# Patient Record
Sex: Male | Born: 1990 | Race: Black or African American | Hispanic: No | Marital: Single | Smoking: Current every day smoker
Health system: Southern US, Community
[De-identification: ages and names within clinical notes are randomized; demographics above are authoritative.]

---

## 2018-08-21 ENCOUNTER — Other Ambulatory Visit: Payer: Self-pay

## 2018-08-21 ENCOUNTER — Encounter: Admission: EM | Payer: Self-pay | Attending: Orthopedic Surgery

## 2018-08-21 ENCOUNTER — Inpatient Hospital Stay

## 2018-08-21 ENCOUNTER — Emergency Department

## 2018-08-21 ENCOUNTER — Inpatient Hospital Stay: Admitting: Anesthesiology

## 2018-08-21 ENCOUNTER — Inpatient Hospital Stay
Admission: EM | Admit: 2018-08-21 | Discharge: 2018-08-22 | DRG: 494 | Attending: Orthopedic Surgery | Admitting: Orthopedic Surgery

## 2018-08-21 ENCOUNTER — Encounter: Payer: Self-pay | Admitting: Orthopedic Surgery

## 2018-08-21 DIAGNOSIS — W010XXA Fall on same level from slipping, tripping and stumbling without subsequent striking against object, initial encounter: Secondary | ICD-10-CM | POA: Diagnosis present

## 2018-08-21 DIAGNOSIS — W19XXXA Unspecified fall, initial encounter: Secondary | ICD-10-CM

## 2018-08-21 DIAGNOSIS — S82142A Displaced bicondylar fracture of left tibia, initial encounter for closed fracture: Principal | ICD-10-CM | POA: Diagnosis present

## 2018-08-21 DIAGNOSIS — Z1159 Encounter for screening for other viral diseases: Secondary | ICD-10-CM | POA: Diagnosis not present

## 2018-08-21 DIAGNOSIS — F17200 Nicotine dependence, unspecified, uncomplicated: Secondary | ICD-10-CM | POA: Diagnosis present

## 2018-08-21 DIAGNOSIS — S82122A Displaced fracture of lateral condyle of left tibia, initial encounter for closed fracture: Secondary | ICD-10-CM | POA: Diagnosis present

## 2018-08-21 DIAGNOSIS — T148XXA Other injury of unspecified body region, initial encounter: Secondary | ICD-10-CM

## 2018-08-21 DIAGNOSIS — I10 Essential (primary) hypertension: Secondary | ICD-10-CM | POA: Diagnosis present

## 2018-08-21 HISTORY — PX: KNEE SURGERY: SHX244

## 2018-08-21 HISTORY — PX: ORIF TIBIA PLATEAU: SHX2132

## 2018-08-21 LAB — BASIC METABOLIC PANEL
Anion gap: 9 (ref 5–15)
BUN: 8 mg/dL (ref 6–20)
CO2: 25 mmol/L (ref 22–32)
Calcium: 8.8 mg/dL — ABNORMAL LOW (ref 8.9–10.3)
Chloride: 103 mmol/L (ref 98–111)
Creatinine, Ser: 1.03 mg/dL (ref 0.61–1.24)
GFR calc Af Amer: >60 mL/min (ref >60)
GFR calc non Af Amer: >60 mL/min (ref >60)
Glucose, Bld: 104 mg/dL — ABNORMAL HIGH (ref 70–99)
Potassium: 3.7 mmol/L (ref 3.5–5.1)
Sodium: 137 mmol/L (ref 135–145)

## 2018-08-21 LAB — CBC
HCT: 43.5 % (ref 39.0–52.0)
Hemoglobin: 14.6 g/dL (ref 13.0–17.0)
MCH: 30.3 pg (ref 26.0–34.0)
MCHC: 33.6 g/dL (ref 30.0–36.0)
MCV: 90.2 fL (ref 80.0–100.0)
Platelets: 189 10*3/uL (ref 150–400)
RBC: 4.82 MIL/uL (ref 4.22–5.81)
RDW: 13.4 % (ref 11.5–15.5)
WBC: 9.3 10*3/uL (ref 4.0–10.5)
nRBC: 0 % (ref 0.0–0.2)

## 2018-08-21 LAB — SARS CORONAVIRUS 2 BY RT PCR (HOSPITAL ORDER, PERFORMED IN ~~LOC~~ HOSPITAL LAB): SARS Coronavirus 2: NEGATIVE

## 2018-08-21 SURGERY — OPEN REDUCTION INTERNAL FIXATION (ORIF) TIBIAL PLATEAU
Anesthesia: General | Site: Leg Lower | Laterality: Left

## 2018-08-21 MED ORDER — MORPHINE SULFATE (PF) 2 MG/ML IV SOLN
0.5000 mg | INTRAVENOUS | Status: DC | PRN
  Administered 2018-08-21 (×3): 0.5 mg via INTRAVENOUS
  Filled 2018-08-21 (×3): qty 1

## 2018-08-21 MED ORDER — ACETAMINOPHEN 500 MG PO TABS
500.0000 mg | ORAL_TABLET | Freq: Four times a day (QID) | ORAL | Status: AC
  Administered 2018-08-21 – 2018-08-22 (×4): 500 mg via ORAL
  Filled 2018-08-21 (×4): qty 1

## 2018-08-21 MED ORDER — METHOCARBAMOL 500 MG PO TABS
500.0000 mg | ORAL_TABLET | Freq: Four times a day (QID) | ORAL | Status: DC | PRN
  Administered 2018-08-21: 500 mg via ORAL
  Filled 2018-08-21: qty 1

## 2018-08-21 MED ORDER — CEFAZOLIN SODIUM-DEXTROSE 2-4 GM/100ML-% IV SOLN
2.0000 g | Freq: Once | INTRAVENOUS | Status: AC
  Administered 2018-08-21: 11:00:00 2 g via INTRAVENOUS
  Filled 2018-08-21: qty 100

## 2018-08-21 MED ORDER — FENTANYL CITRATE (PF) 100 MCG/2ML IJ SOLN: 25.0000 ug | INTRAMUSCULAR | Status: DC | PRN

## 2018-08-21 MED ORDER — ONDANSETRON HCL 4 MG PO TABS: 4.0000 mg | ORAL_TABLET | Freq: Four times a day (QID) | ORAL | Status: DC | PRN

## 2018-08-21 MED ORDER — ONDANSETRON HCL 4 MG/2ML IJ SOLN
4.0000 mg | Freq: Once | INTRAMUSCULAR | Status: AC
  Administered 2018-08-21: 03:00:00 4 mg via INTRAVENOUS
  Filled 2018-08-21: qty 2

## 2018-08-21 MED ORDER — TRAMADOL HCL 50 MG PO TABS
50.0000 mg | ORAL_TABLET | Freq: Four times a day (QID) | ORAL | Status: DC
  Administered 2018-08-21 – 2018-08-22 (×3): 50 mg via ORAL
  Filled 2018-08-21 (×3): qty 1

## 2018-08-21 MED ORDER — SODIUM CHLORIDE 0.9 % IV SOLN
INTRAVENOUS | Status: DC
  Administered 2018-08-21 – 2018-08-22 (×2): via INTRAVENOUS

## 2018-08-21 MED ORDER — ENALAPRIL MALEATE 10 MG PO TABS
10.0000 mg | ORAL_TABLET | Freq: Every day | ORAL | Status: DC
  Administered 2018-08-21 – 2018-08-22 (×2): 10 mg via ORAL
  Filled 2018-08-21 (×2): qty 1

## 2018-08-21 MED ORDER — CEFAZOLIN SODIUM 1 G IJ SOLR
INTRAMUSCULAR | Status: AC
  Filled 2018-08-21: qty 20

## 2018-08-21 MED ORDER — PHENYLEPHRINE HCL (PRESSORS) 10 MG/ML IV SOLN
INTRAVENOUS | Status: DC | PRN
  Administered 2018-08-21 (×2): 200 ug via INTRAVENOUS
  Administered 2018-08-21: 100 ug via INTRAVENOUS
  Administered 2018-08-21 (×3): 200 ug via INTRAVENOUS
  Administered 2018-08-21: 100 ug via INTRAVENOUS

## 2018-08-21 MED ORDER — MAGNESIUM HYDROXIDE 400 MG/5ML PO SUSP: 30.0000 mL | Freq: Every day | ORAL | Status: DC | PRN

## 2018-08-21 MED ORDER — CEFAZOLIN SODIUM-DEXTROSE 2-4 GM/100ML-% IV SOLN
2.0000 g | Freq: Four times a day (QID) | INTRAVENOUS | Status: AC
  Administered 2018-08-21 – 2018-08-22 (×3): 2 g via INTRAVENOUS
  Filled 2018-08-21 (×3): qty 100

## 2018-08-21 MED ORDER — HYDROCODONE-ACETAMINOPHEN 7.5-325 MG PO TABS: 1.0000 | ORAL_TABLET | Freq: Once | ORAL | Status: DC | PRN

## 2018-08-21 MED ORDER — FENTANYL CITRATE (PF) 100 MCG/2ML IJ SOLN
INTRAMUSCULAR | Status: DC | PRN
  Administered 2018-08-21 (×5): 50 ug via INTRAVENOUS

## 2018-08-21 MED ORDER — ONDANSETRON HCL 4 MG/2ML IJ SOLN
INTRAMUSCULAR | Status: DC | PRN
  Administered 2018-08-21: 4 mg via INTRAVENOUS

## 2018-08-21 MED ORDER — ACETAMINOPHEN 160 MG/5ML PO SOLN
325.0000 mg | ORAL | Status: DC | PRN
  Filled 2018-08-21: qty 20.3

## 2018-08-21 MED ORDER — HYDROMORPHONE HCL 1 MG/ML IJ SOLN
1.0000 mg | Freq: Once | INTRAMUSCULAR | Status: AC
  Administered 2018-08-21: 1 mg via INTRAVENOUS
  Filled 2018-08-21: qty 1

## 2018-08-21 MED ORDER — ACETAMINOPHEN 325 MG PO TABS: 325.0000 mg | ORAL_TABLET | ORAL | Status: DC | PRN

## 2018-08-21 MED ORDER — HYDROCODONE-ACETAMINOPHEN 5-325 MG PO TABS
1.0000 | ORAL_TABLET | Freq: Four times a day (QID) | ORAL | Status: DC | PRN
  Administered 2018-08-21 – 2018-08-22 (×3): 2 via ORAL
  Filled 2018-08-21 (×4): qty 2

## 2018-08-21 MED ORDER — MIDAZOLAM HCL 5 MG/5ML IJ SOLN
INTRAMUSCULAR | Status: DC | PRN
  Administered 2018-08-21: 2 mg via INTRAVENOUS

## 2018-08-21 MED ORDER — MEPERIDINE HCL 50 MG/ML IJ SOLN
INTRAMUSCULAR | Status: AC
  Filled 2018-08-21: qty 1

## 2018-08-21 MED ORDER — PROPOFOL 10 MG/ML IV BOLUS
INTRAVENOUS | Status: AC
  Filled 2018-08-21: qty 20

## 2018-08-21 MED ORDER — ONDANSETRON HCL 4 MG/2ML IJ SOLN
INTRAMUSCULAR | Status: AC
  Filled 2018-08-21: qty 2

## 2018-08-21 MED ORDER — ZOLPIDEM TARTRATE 5 MG PO TABS
5.0000 mg | ORAL_TABLET | Freq: Every evening | ORAL | Status: DC | PRN
  Administered 2018-08-21: 22:00:00 5 mg via ORAL
  Filled 2018-08-21: qty 1

## 2018-08-21 MED ORDER — PHENYLEPHRINE HCL (PRESSORS) 10 MG/ML IV SOLN
INTRAVENOUS | Status: AC
  Filled 2018-08-21: qty 1

## 2018-08-21 MED ORDER — PROPOFOL 10 MG/ML IV BOLUS
INTRAVENOUS | Status: DC | PRN
  Administered 2018-08-21: 200 mg via INTRAVENOUS

## 2018-08-21 MED ORDER — METHOCARBAMOL 1000 MG/10ML IJ SOLN
500.0000 mg | Freq: Four times a day (QID) | INTRAVENOUS | Status: DC | PRN
  Filled 2018-08-21: qty 5

## 2018-08-21 MED ORDER — MEPERIDINE HCL 50 MG/ML IJ SOLN
6.2500 mg | INTRAMUSCULAR | Status: DC | PRN
  Administered 2018-08-21: 12.5 mg via INTRAVENOUS

## 2018-08-21 MED ORDER — ONDANSETRON HCL 4 MG/2ML IJ SOLN: 4.0000 mg | Freq: Four times a day (QID) | INTRAMUSCULAR | Status: DC | PRN

## 2018-08-21 MED ORDER — METOCLOPRAMIDE HCL 5 MG/ML IJ SOLN: 5.0000 mg | Freq: Three times a day (TID) | INTRAMUSCULAR | Status: DC | PRN

## 2018-08-21 MED ORDER — MORPHINE SULFATE (PF) 4 MG/ML IV SOLN
4.0000 mg | Freq: Once | INTRAVENOUS | Status: AC
  Administered 2018-08-21: 05:00:00 4 mg via INTRAVENOUS
  Filled 2018-08-21: qty 1

## 2018-08-21 MED ORDER — ENOXAPARIN SODIUM 40 MG/0.4ML ~~LOC~~ SOLN
40.0000 mg | SUBCUTANEOUS | Status: DC
  Administered 2018-08-22: 40 mg via SUBCUTANEOUS
  Filled 2018-08-21: qty 0.4

## 2018-08-21 MED ORDER — DOCUSATE SODIUM 100 MG PO CAPS
100.0000 mg | ORAL_CAPSULE | Freq: Two times a day (BID) | ORAL | Status: DC
  Administered 2018-08-21 – 2018-08-22 (×2): 100 mg via ORAL
  Filled 2018-08-21 (×2): qty 1

## 2018-08-21 MED ORDER — GLYCOPYRROLATE 0.2 MG/ML IJ SOLN
INTRAMUSCULAR | Status: DC | PRN
  Administered 2018-08-21: 0.2 mg via INTRAVENOUS

## 2018-08-21 MED ORDER — BISACODYL 5 MG PO TBEC: 5.0000 mg | DELAYED_RELEASE_TABLET | Freq: Every day | ORAL | Status: DC | PRN

## 2018-08-21 MED ORDER — METOCLOPRAMIDE HCL 10 MG PO TABS: 5.0000 mg | ORAL_TABLET | Freq: Three times a day (TID) | ORAL | Status: DC | PRN

## 2018-08-21 MED ORDER — NEOMYCIN-POLYMYXIN B GU 40-200000 IR SOLN
Status: DC | PRN
  Administered 2018-08-21: 4 mL

## 2018-08-21 MED ORDER — ACETAMINOPHEN 325 MG PO TABS: 325.0000 mg | ORAL_TABLET | Freq: Four times a day (QID) | ORAL | Status: DC | PRN

## 2018-08-21 MED ORDER — MAGNESIUM CITRATE PO SOLN
1.0000 | Freq: Once | ORAL | Status: DC | PRN
  Filled 2018-08-21: qty 296

## 2018-08-21 MED ORDER — FENTANYL CITRATE (PF) 250 MCG/5ML IJ SOLN
INTRAMUSCULAR | Status: AC
  Filled 2018-08-21: qty 5

## 2018-08-21 MED ORDER — MIDAZOLAM HCL 2 MG/2ML IJ SOLN
INTRAMUSCULAR | Status: AC
  Filled 2018-08-21: qty 2

## 2018-08-21 MED ORDER — PROMETHAZINE HCL 25 MG/ML IJ SOLN: 6.2500 mg | INTRAMUSCULAR | Status: DC | PRN

## 2018-08-21 MED ORDER — GLYCOPYRROLATE 0.2 MG/ML IJ SOLN
INTRAMUSCULAR | Status: AC
  Filled 2018-08-21: qty 1

## 2018-08-21 MED ORDER — SODIUM CHLORIDE 0.9 % IV SOLN
INTRAVENOUS | Status: DC
  Administered 2018-08-21: 07:00:00 via INTRAVENOUS

## 2018-08-21 SURGICAL SUPPLY — 56 items
BANDAGE ACE 6X5 VEL STRL LF (GAUZE/BANDAGES/DRESSINGS) ×3 IMPLANT
BIT DRILL 2.5X2.75 QC CALB (BIT) ×3 IMPLANT
BIT DRILL CAL (BIT) ×1 IMPLANT
CANISTER SUCT 1200ML W/VALVE (MISCELLANEOUS) ×3 IMPLANT
CAST PADDING 6X4YD ST 30248 (SOFTGOODS) ×2
CHLORAPREP W/TINT 26 (MISCELLANEOUS) ×3 IMPLANT
COOLER POLAR GLACIER W/PUMP (MISCELLANEOUS) ×3 IMPLANT
COVER BACK TABLE REUSABLE LG (DRAPES) ×3 IMPLANT
COVER WAND RF STERILE (DRAPES) ×3 IMPLANT
CUFF TOURN SGL QUICK 24 (TOURNIQUET CUFF) ×2
CUFF TOURN SGL QUICK 30 (TOURNIQUET CUFF)
CUFF TRNQT CYL 24X4X16.5-23 (TOURNIQUET CUFF) ×1 IMPLANT
CUFF TRNQT CYL 30X4X21-28X (TOURNIQUET CUFF) IMPLANT
DRAPE 3/4 80X56 (DRAPES) ×3 IMPLANT
DRAPE C-ARM XRAY 36X54 (DRAPES) ×3 IMPLANT
DRAPE C-ARMOR (DRAPES) ×3 IMPLANT
DRILL BIT CAL (BIT) ×3
ELECT CAUTERY BLADE 6.4 (BLADE) ×3 IMPLANT
ELECT REM PT RETURN 9FT ADLT (ELECTROSURGICAL) ×3
ELECTRODE REM PT RTRN 9FT ADLT (ELECTROSURGICAL) ×1 IMPLANT
GAUZE SPONGE 4X4 12PLY STRL (GAUZE/BANDAGES/DRESSINGS) ×3 IMPLANT
GAUZE XEROFORM 1X8 LF (GAUZE/BANDAGES/DRESSINGS) ×3 IMPLANT
GLOVE SURG SYN 9.0  PF PI (GLOVE) ×2
GLOVE SURG SYN 9.0 PF PI (GLOVE) ×1 IMPLANT
GOWN SRG 2XL LVL 4 RGLN SLV (GOWNS) ×1 IMPLANT
GOWN STRL NON-REIN 2XL LVL4 (GOWNS) ×2
GOWN STRL REUS W/ TWL LRG LVL3 (GOWN DISPOSABLE) ×1 IMPLANT
GOWN STRL REUS W/TWL LRG LVL3 (GOWN DISPOSABLE) ×2
K-WIRE ACE 1.6X6 (WIRE) ×6
KIT TURNOVER KIT A (KITS) ×3 IMPLANT
KWIRE ACE 1.6X6 (WIRE) ×2 IMPLANT
NEEDLE HYPO 22GX1.5 SAFETY (NEEDLE) ×3 IMPLANT
NS IRRIG 1000ML POUR BTL (IV SOLUTION) ×3 IMPLANT
PACK TOTAL KNEE (MISCELLANEOUS) ×3 IMPLANT
PAD ABD DERMACEA PRESS 5X9 (GAUZE/BANDAGES/DRESSINGS) ×3 IMPLANT
PAD PREP 24X41 OB/GYN DISP (PERSONAL CARE ITEMS) ×3 IMPLANT
PAD WRAPON POLAR KNEE (MISCELLANEOUS) ×1 IMPLANT
PADDING CAST COTTON 6X4 ST (SOFTGOODS) ×1 IMPLANT
PLATE LOCK 5H STD LT PROX TIB (Plate) ×3 IMPLANT
SCALPEL PROTECTED #10 DISP (BLADE) ×6 IMPLANT
SCREW CORTICAL 3.5MM  44MM (Screw) ×6 IMPLANT
SCREW CORTICAL 3.5MM 44MM (Screw) ×3 IMPLANT
SCREW LOCK CORT STAR 3.5X54 (Screw) ×3 IMPLANT
SCREW LOCK CORT STAR 3.5X56 (Screw) ×9 IMPLANT
SCREW LOCK CORT STAR 3.5X58 (Screw) ×3 IMPLANT
SCREW LOCK CORT STAR 3.5X60 (Screw) ×9 IMPLANT
SCREW LOCK CORT STAR 3.5X70 (Screw) ×3 IMPLANT
STAPLER SKIN PROX 35W (STAPLE) ×3 IMPLANT
SUT VIC AB 0 CT1 27 (SUTURE) ×2
SUT VIC AB 0 CT1 27XCR 8 STRN (SUTURE) ×1 IMPLANT
SUT VIC AB 1 CT1 36 (SUTURE) ×3 IMPLANT
SUT VIC AB 2-0 CT1 27 (SUTURE) ×2
SUT VIC AB 2-0 CT1 TAPERPNT 27 (SUTURE) ×1 IMPLANT
SYR 10ML LL (SYRINGE) ×3 IMPLANT
TRAY FOLEY MTR SLVR 16FR STAT (SET/KITS/TRAYS/PACK) ×3 IMPLANT
WRAPON POLAR PAD KNEE (MISCELLANEOUS) ×3

## 2018-08-21 NOTE — Anesthesia Post-op Follow-up Note (Signed)
Anesthesia QCDR form completed.        

## 2018-08-21 NOTE — Plan of Care (Signed)
  Problem: Health Behavior/Discharge Planning: Goal: Ability to manage health-related needs will improve Outcome: Progressing   Problem: Clinical Measurements: Goal: Ability to maintain clinical measurements within normal limits will improve Outcome: Progressing Goal: Will remain free from infection Outcome: Progressing Goal: Cardiovascular complication will be avoided Outcome: Progressing   Problem: Activity: Goal: Risk for activity intolerance will decrease Outcome: Progressing   Problem: Nutrition: Goal: Adequate nutrition will be maintained Outcome: Progressing   Problem: Coping: Goal: Level of anxiety will decrease Outcome: Progressing   Problem: Elimination: Goal: Will not experience complications related to bowel motility Outcome: Progressing Goal: Will not experience complications related to urinary retention Outcome: Progressing   Problem: Pain Managment: Goal: General experience of comfort will improve Outcome: Progressing   Problem: Safety: Goal: Ability to remain free from injury will improve Outcome: Progressing   Problem: Skin Integrity: Goal: Risk for impaired skin integrity will decrease Outcome: Progressing   

## 2018-08-21 NOTE — Plan of Care (Signed)
  Problem: Health Behavior/Discharge Planning: Goal: Ability to manage health-related needs will improve 08/21/2018 1539 by Verdene Rio, RN Outcome: Progressing 08/21/2018 0930 by Verdene Rio, RN Outcome: Progressing

## 2018-08-21 NOTE — H&P (Addendum)
Subjective:   Patient is a 28 y.o. male presents with left knee pain. Onset of symptoms was abrupt starting 10 hours ago with unchanged course since that time. The pain is located around the left knee. Patient describes the pain as severe continuous and rated as diffuse. Pain has been associated with a fall he suffered while slipping on water. Patient denies loss of consciousness. Symptoms are aggravated by any motion of the leg. Symptoms improve with nothing. Past history includes no prior problems with the knee.  Previous studies include x-ray and CT done in ER showing comminuted displaced lateral tibial plateau fracture left knee.  Patient Active Problem List   Diagnosis Date Noted  . Closed fracture of lateral portion of left tibial plateau 08/21/2018   Past medical history is positive for hypertension  Current medications: Enalapril 10 mg p.o. daily  Allergies  Allergen Reactions  . Coconut Oil Rash    Social History   Tobacco Use  . Smoking status: Not on file  Substance Use Topics  . Alcohol use: Not on file    No family history on file.  Review of Systems Pertinent items are noted in HPI.  Objective:   Patient Vitals for the past 8 hrs:  BP Temp Temp src Pulse Resp SpO2 Height Weight  08/21/18 0700 (!) 155/95 99.9 F (37.7 C) Oral 73 17 100 % - -  08/21/18 0652 (!) 146/98 99.3 F (37.4 C) Oral 78 16 100 % - -  08/21/18 0600 (!) 141/90 - - 72 - 100 % - -  08/21/18 0530 (!) 147/89 - - 72 - 100 % - -  08/21/18 0503 - - - 65 - 99 % - -  08/21/18 0502 (!) 162/92 - - - - - - -  08/21/18 0330 (!) 151/96 - - 72 - 100 % - -  08/21/18 0238 (!) 162/97 - - - - - - -  08/21/18 0237 - 98.9 F (37.2 C) Oral 94 (!) 24 99 % 5\' 6"  (1.676 m) 68.5 kg   No intake/output data recorded. No intake/output data recorded.    BP (!) 155/95 (BP Location: Left Arm)   Pulse 73   Temp 99.9 F (37.7 C) (Oral)   Resp 17   Ht 5\' 6"  (1.676 m)   Wt 68.5 kg   SpO2 100%   BMI 24.37 kg/m    General Appearance:    Alert, cooperative, no distress, appears stated age  Head:    Normocephalic, without obvious abnormality, atraumatic           Throat:   Lips, mucosa, and tongue normal; teeth and gums normal     Back:     Symmetric, no curvature, ROM normal, no CVA tenderness  Lungs:     Clear to auscultation bilaterally, respirations unlabored  Chest wall:    No tenderness or deformity  Heart:    Regular rate and rhythm, S1 and S2 normal, no murmur, rub   or gallop  Abdomen:     Soft, non-tender, bowel sounds active all four quadrants,    no masses, no organomegaly  Genitalia:    Normal male without lesion, discharge or tenderness  Rectal:    Normal tone, normal prostate, no masses or tenderness;   guaiac negative stool  Extremities: [place] swelling around the left knee with intact skin  Pulses:   2+ and symmetric all extremities  Skin:   Skin color, texture, turgor normal, no rashes or lesions  Lymph nodes:  Cervical, supraclavicular, and axillary nodes normal  Neurologic:  Sensation to the dorsum of the foot is intact however he is unable to extend any of his toes or ankle    ECG: no prior ECG.  Data ReviewRadiology review: Comminuted lateral tibial plateau fracture on both x-ray and CT  Assessment:   Active Problems:   Closed fracture of lateral portion of left tibial plateau   Plan:   Open reduction internal fixation left lateral tibial plateau, risks, benefits, alternatives discussed with patient.

## 2018-08-21 NOTE — Op Note (Signed)
08/21/2018  12:12 PM  PATIENT:  Marvin Roberts  28 y.o. male  PRE-OPERATIVE DIAGNOSIS:  tibial fracture lateral tibial plateau, left displaced  POST-OPERATIVE DIAGNOSIS:  tibial fracture lateral tibial plateau left displaced  PROCEDURE:  Procedure(s): OPEN REDUCTION INTERNAL FIXATION (ORIF) TIBIAL PLATEAU (Left)  SURGEON: Laurene Footman, MD  ASSISTANTS: None  ANESTHESIA:   general  EBL:  Total I/O In: 700 [I.V.:700] Out: 700 [Urine:700]  BLOOD ADMINISTERED:none  DRAINS: none   LOCAL MEDICATIONS USED:  NONE  SPECIMEN:  No Specimen  DISPOSITION OF SPECIMEN:  N/A  COUNTS:  YES  TOURNIQUET:   Total Tourniquet Time Documented: Thigh (Left) - 43 minutes Total: Thigh (Left) - 43 minutes   IMPLANTS: Biomet lateral tibial buttress plate with multiple screws  DICTATION: .Dragon Dictation patient was brought to the operating room and after adequate general anesthesia was obtained a tourniquet was applied to the left upper thigh.  After prepping and draping in the usual sterile manner appropriate patient identification and timeout procedures were completed.  C arm was brought in and with varus stress partial correction of the deformity was obtained.  Appropriate patient identification and timeout procedures were completed and tourniquet raised with a anterior incision over the tibial spine curving laterally towards the fibula.  Thick flaps were developed and the anterior muscles were elevated off the anterior lateral tibia with application of a buttress plate which was then forced more proximally to help lift that lateral fragment and held in place with K wires.  Cortical screws were then placed to bring the plate up against the bone with 3 cortical screws placed.  The 2 oblique screws were inserted with fluoroscopic guidance and position of the plate checked in AP and lateral imaging showing good alignment of the plate and fracture.  The proximal screws were all filled using standard  technique drilling measuring off the drill guide and placing the locking screws.  With fluoroscopy fluoroscopic views going through range of motion there is no penetration of hardware into the joint on AP and lateral imaging the displaced lateral tibial depressed fragment appeared to be elevated and the intra-articular split appeared to be reduced.  The knee was stable to examination.  The wound was copiously irrigated following this with tourniquet let down.  #1 Vicryl was used to repair the deep fascia with 2-0 Vicryl subcutaneously and skin staples followed by Xeroform 4 x 4's ABD web roll Polar Care Ace wrap patient sent to recovery in stable condition  PLAN OF CARE: Admit for overnight observation  PATIENT DISPOSITION:  PACU - hemodynamically stable.

## 2018-08-21 NOTE — ED Notes (Signed)
Patient resting quietly with eyes closed

## 2018-08-21 NOTE — Anesthesia Preprocedure Evaluation (Signed)
Anesthesia Evaluation  Patient identified by MRN, date of birth, ID band Patient awake    Reviewed: Allergy & Precautions, H&P , NPO status , reviewed documented beta blocker date and time   Airway Mallampati: II  TM Distance: >3 FB Neck ROM: full    Dental  (+) Chipped, Dental Advidsory Given Gapped teeth, no loose:   Pulmonary Current Smoker,    Pulmonary exam normal        Cardiovascular hypertension, Normal cardiovascular exam     Neuro/Psych    GI/Hepatic neg GERD  ,  Endo/Other    Renal/GU      Musculoskeletal   Abdominal   Peds  Hematology   Anesthesia Other Findings History reviewed. No pertinent past medical history x HTN. History reviewed. No pertinent surgical history. BMI    Body Mass Index: 24.37 kg/m     Reproductive/Obstetrics                             Anesthesia Physical Anesthesia Plan  ASA: II and emergent  Anesthesia Plan: General   Post-op Pain Management:    Induction: Intravenous  PONV Risk Score and Plan: Ondansetron and Treatment may vary due to age or medical condition  Airway Management Planned: LMA  Additional Equipment:   Intra-op Plan:   Post-operative Plan: Extubation in OR  Informed Consent: I have reviewed the patients History and Physical, chart, labs and discussed the procedure including the risks, benefits and alternatives for the proposed anesthesia with the patient or authorized representative who has indicated his/her understanding and acceptance.     Dental Advisory Given  Plan Discussed with: CRNA  Anesthesia Plan Comments:         Anesthesia Quick Evaluation

## 2018-08-21 NOTE — ED Notes (Signed)
Patient transported to CT 

## 2018-08-21 NOTE — ED Triage Notes (Signed)
Pt from jail with left leg injury. Pt assisted out of cop car onto bed. Pt with deformity noted to upper tibial area.

## 2018-08-21 NOTE — Progress Notes (Signed)
15 minute call to floor. 

## 2018-08-21 NOTE — Anesthesia Postprocedure Evaluation (Signed)
Anesthesia Post Note  Patient: Marcene Brawn  Procedure(s) Performed: OPEN REDUCTION INTERNAL FIXATION (ORIF) TIBIAL PLATEAU (Left Leg Lower)  Patient location during evaluation: PACU Anesthesia Type: General Level of consciousness: awake and alert Pain management: pain level controlled Vital Signs Assessment: post-procedure vital signs reviewed and stable Respiratory status: spontaneous breathing, nonlabored ventilation and respiratory function stable Cardiovascular status: blood pressure returned to baseline and stable Postop Assessment: no apparent nausea or vomiting Anesthetic complications: no     Last Vitals:  Vitals:   08/21/18 1538 08/21/18 1642  BP: (!) 149/79 (!) 156/82  Pulse: (!) 108 (!) 114  Resp: 18 17  Temp: 36.8 C   SpO2: 100% 100%    Last Pain:  Vitals:   08/21/18 1538  TempSrc: Oral  PainSc:                  Alphonsus Sias

## 2018-08-21 NOTE — ED Notes (Signed)
Patient oxygen saturation decreased to 84% on room air. Patient placed on 2l oxygen and saturations increased to 98%. Dr. Alfred Levins notified.

## 2018-08-21 NOTE — ED Provider Notes (Signed)
[facility] Emergency Department Provider Note  ____________________________________________  Time seen: Approximately 4:18 AM  I have reviewed the triage vital signs and the nursing notes.   HISTORY  Chief Complaint Leg Injury   HPI Marvin Roberts is a 28 y.o. male with a history of hypertension who presents for evaluation of left knee pain.  Patient is currently incarcerated.  He was playing outside when he slipped in water and fell onto his knee.  Unable to get up or bear any weight.  Patient is complaining of severe constant sharp pain located in his left knee radiating up and down his leg.  He denies head trauma or LOC.  Denies neck pain or back pain.  Denies any other extremity pain  PMH HTN  Allergies Coconut oil  No family history on file.  Social History Smoking - yes Alcohol - denies Drugs - denies  Review of Systems Constitutional: Negative for fever. Eyes: Negative for visual changes. ENT: Negative for facial injury or neck injury Cardiovascular: Negative for chest injury. Respiratory: Negative for shortness of breath. Negative for chest wall injury. Gastrointestinal: Negative for abdominal pain or injury. Genitourinary: Negative for dysuria. Musculoskeletal: Negative for back injury, + L knee pain Skin: Negative for laceration/abrasions. Neurological: Negative for head injury.   ____________________________________________   PHYSICAL EXAM:  VITAL SIGNS: ED Triage Vitals  Enc Vitals Group     BP 08/21/18 0238 (!) 162/97     Pulse Rate 08/21/18 0237 94     Resp 08/21/18 0237 (!) 24     Temp 08/21/18 0237 98.9 F (37.2 C)     Temp Source 08/21/18 0237 Oral     SpO2 08/21/18 0237 99 %     Weight 08/21/18 0237 151 lb (68.5 kg)     Height 08/21/18 0237 5\' 6"  (1.676 m)     Head Circumference --      Peak Flow --      Pain Score 08/21/18 0237 10     Pain Loc --      Pain Edu? --      Excl. in [place]? --    Full spinal  precautions maintained throughout the trauma exam. Constitutional: Alert and oriented. Screaming in pain. Does not appear intoxicated. HEENT Head: Normocephalic and atraumatic. Face: No facial bony tenderness. Stable midface Ears: No hemotympanum bilaterally. No Battle sign Eyes: No eye injury. PERRL. No raccoon eyes Nose: Nontender. No epistaxis. No rhinorrhea Mouth/Throat: Mucous membranes are moist. No oropharyngeal blood. No dental injury. Airway patent without stridor. Normal voice. Neck: no C-collar. No midline c-spine tenderness.  Cardiovascular: Normal rate, regular rhythm. Normal and symmetric distal pulses are present in all extremities. Pulmonary/Chest: Chest wall is stable and nontender to palpation/compression. Normal respiratory effort. Breath sounds are normal. No crepitus.  Abdominal: Soft, nontender, non distended. Musculoskeletal: Patient has severe tenderness to palpation over the tibial plateau on the left with swelling but no obvious deformity. Nontender with normal full range of motion in all other joints. No deformities. No thoracic or lumbar midline spinal tenderness. Pelvis is stable. Skin: Skin is warm, dry and intact. No abrasions or contutions. Psychiatric: Speech and behavior are appropriate. Neurological: Normal speech and language. Moves all extremities to command. No gross focal neurologic deficits are appreciated.  Glascow Coma Score: 4 - Opens eyes on own 6 - Follows simple motor commands 5 - Alert and oriented GCS: 15  ____________________________________________   LABS (all labs ordered are listed, but only abnormal results are displayed)  Labs Reviewed  SARS CORONAVIRUS 2 (HOSPITAL ORDER, PERFORMED IN [facility])  HIV ANTIBODY (ROUTINE TESTING W REFLEX)  CBC  BASIC METABOLIC PANEL   ____________________________________________  EKG  none  ____________________________________________  RADIOLOGY  I have personally reviewed  the images performed during this visit and I agree with the Radiologist's read.   Interpretation by Radiologist:  Dg Tibia/fibula Left  Result Date: 08/21/2018 CLINICAL DATA:  Left knee pain after slip on water and fall. EXAM: LEFT TIBIA AND FIBULA - 2 VIEW COMPARISON:  None. FINDINGS: Lateral tibial plateau fracture better assessed on concurrent knee exam. Distal tibia is intact. No evidence of fibular fracture. Soft tissue edema about the knee and proximal aspect of the lower leg. IMPRESSION: Lateral tibial plateau fracture. Distal tibia and fibula are intact. Electronically Signed   By: Narda RutherfordMelanie  Sanford M.D.   On: 08/21/2018 03:28   Dg Knee Complete 4 Views Left  Result Date: 08/21/2018 CLINICAL DATA:  Left knee pain after slip on water and fall. EXAM: LEFT KNEE - COMPLETE 4+ VIEW COMPARISON:  None. FINDINGS: Comminuted displaced lateral tibial plateau fracture. Fracture extends to the tibial spine. No significant metaphyseal component demonstrated radiographically. No additional fracture. Large knee joint effusion. IMPRESSION: Comminuted displaced lateral tibial plateau fracture. Electronically Signed   By: Narda RutherfordMelanie  Sanford M.D.   On: 08/21/2018 03:29     ____________________________________________   PROCEDURES  Procedure(s) performed: None Procedures Critical Care performed: None ____________________________________________   INITIAL IMPRESSION / ASSESSMENT AND PLAN / ED COURSE  28 y.o. male with a history of hypertension who presents for evaluation of left knee pain status post mechanical fall.  X-ray confirms comminuted displaced lateral tibial plateau fracture.  Discussed with Dr. Rosita KeaMenz who recommended admission for surgical repair.  He also requested a CT of the knee which has been done.  No labs required per Dr. Rosita KeaMenz. Patient made NPO.       As part of my medical decision making, I reviewed the following data within the electronic MEDICAL RECORD NUMBER Nursing notes reviewed and  incorporated, Old chart reviewed, Radiograph reviewed , A consult was requested and obtained from this/these consultant(s) Orthopedics, Notes from prior ED visits and [place] Controlled Substance Database   Patient was evaluated in Emergency Department today for the symptoms described in the history of present illness. Patient was evaluated in the context of the global COVID-19 pandemic, which necessitated consideration that the patient might be at risk for infection with the SARS-CoV-2 virus that causes COVID-19. Institutional protocols and algorithms that pertain to the evaluation of patients at risk for COVID-19 are in a state of rapid change based on information released by regulatory bodies including the CDC and federal and state organizations. These policies and algorithms were followed during the patient's care in the ED.   ____________________________________________   FINAL CLINICAL IMPRESSION(S) / ED DIAGNOSES   Final diagnoses:  Fall, initial encounter  Closed fracture of left tibial plateau, initial encounter      NEW MEDICATIONS STARTED DURING THIS VISIT:  ED Discharge Orders    None       Note:  This document was prepared using Dragon voice recognition software and may include unintentional dictation errors.    Don PerkingVeronese, [place], MD 08/21/18 [phone]

## 2018-08-21 NOTE — ED Notes (Signed)
Patient requesting more pain medication. Dr. Alfred Levins notified.

## 2018-08-21 NOTE — Progress Notes (Signed)
Reinforced dressing with abd pads and an additional ace wrap.

## 2018-08-21 NOTE — Anesthesia Procedure Notes (Signed)
Procedure Name: LMA Insertion Performed by: Royetta Probus, CRNA Pre-anesthesia Checklist: Patient identified, Patient being monitored, Timeout performed, Emergency Drugs available and Suction available Patient Re-evaluated:Patient Re-evaluated prior to induction Oxygen Delivery Method: Circle system utilized Preoxygenation: Pre-oxygenation with 100% oxygen Induction Type: IV induction Ventilation: Mask ventilation without difficulty LMA: LMA inserted LMA Size: 4.0 Tube type: Oral Number of attempts: 1 Placement Confirmation: positive ETCO2 and breath sounds checked- equal and bilateral Tube secured with: Tape Dental Injury: Teeth and Oropharynx as per pre-operative assessment        

## 2018-08-21 NOTE — Transfer of Care (Signed)
Immediate Anesthesia Transfer of Care Note  Patient: Marvin Roberts  Procedure(s) Performed: OPEN REDUCTION INTERNAL FIXATION (ORIF) TIBIAL PLATEAU (Left Leg Lower)  Patient Location: PACU  Anesthesia Type:General  Level of Consciousness: sedated  Airway & Oxygen Therapy: Patient Spontanous Breathing and Patient connected to face mask oxygen  Post-op Assessment: Report given to RN and Post -op Vital signs reviewed and stable  Post vital signs: Reviewed  Last Vitals:  Vitals Value Taken Time  BP 102/50 08/21/18 1214  Temp 36.4 C 08/21/18 1214  Pulse 97 08/21/18 1214  Resp 12 08/21/18 1214  SpO2 100 % 08/21/18 1214    Last Pain:  Vitals:   08/21/18 0734  TempSrc:   PainSc: Asleep      Patients Stated Pain Goal: 0 (37/10/62 6948)  Complications: No apparent anesthesia complications

## 2018-08-22 ENCOUNTER — Encounter: Payer: Self-pay | Admitting: Orthopedic Surgery

## 2018-08-22 LAB — MRSA PCR SCREENING: MRSA by PCR: NEGATIVE

## 2018-08-22 MED ORDER — TRAMADOL HCL 50 MG PO TABS: 50.0000 mg | ORAL_TABLET | Freq: Four times a day (QID) | ORAL | 0 refills | Status: DC

## 2018-08-22 MED ORDER — HYDROCODONE-ACETAMINOPHEN 5-325 MG PO TABS: 1.0000 | ORAL_TABLET | Freq: Four times a day (QID) | ORAL | 0 refills | Status: DC | PRN

## 2018-08-22 MED ORDER — MORPHINE SULFATE (PF) 2 MG/ML IV SOLN
2.0000 mg | INTRAVENOUS | Status: AC
  Administered 2018-08-22: 2 mg via INTRAVENOUS
  Filled 2018-08-22: qty 1

## 2018-08-22 MED ORDER — HYDRALAZINE HCL 20 MG/ML IJ SOLN
10.0000 mg | Freq: Once | INTRAMUSCULAR | Status: AC
  Administered 2018-08-22: 02:00:00 10 mg via INTRAVENOUS
  Filled 2018-08-22: qty 1

## 2018-08-22 MED ORDER — ASPIRIN EC 325 MG PO TBEC: 325.0000 mg | DELAYED_RELEASE_TABLET | Freq: Every day | ORAL | 0 refills | Status: DC

## 2018-08-22 NOTE — Progress Notes (Signed)
  Subjective: 1 Day Post-Op Procedure(s) (LRB): OPEN REDUCTION INTERNAL FIXATION (ORIF) TIBIAL PLATEAU (Left) Patient reports pain as 8 on 0-10 scale.   Patient is well, and has had no acute complaints or problems Patient is currently incarcerated, will plan for return to custody upon discharge. Negative for chest pain and shortness of breath Fever: no Gastrointestinal:Negative for nausea and vomiting  Objective: Vital signs in last 24 hours: Temp:  [97.6 F (36.4 C)-100 F (37.8 C)] 98.3 F (36.8 C) (07/20 0736) Pulse Rate:  [79-118] 95 (07/20 0736) Resp:  [12-22] 19 (07/20 0411) BP: (102-170)/(50-102) 141/99 (07/20 0736) SpO2:  [97 %-100 %] 99 % (07/20 0736)  Intake/Output from previous day:  Intake/Output Summary (Last 24 hours) at 08/22/2018 0741 Last data filed at 08/22/2018 0530 Gross per 24 hour  Intake 2423.08 ml  Output 2360 ml  Net 63.08 ml    Intake/Output this shift: No intake/output data recorded.  Labs: Recent Labs    08/21/18 0658  HGB 14.6   Recent Labs    08/21/18 0658  WBC 9.3  RBC 4.82  HCT 43.5  PLT 189   Recent Labs    08/21/18 0658  NA 137  K 3.7  CL 103  CO2 25  BUN 8  CREATININE 1.03  GLUCOSE 104*  CALCIUM 8.8*   No results for input(s): LABPT, INR in the last 72 hours.   EXAM General - Patient is Alert and Appropriate Extremity - Sensation intact distally Intact pulses distally  Bulky dressing is intact to the left knee, patient anxious with any manipulation of the leg. Compartments to the left lower extremity are soft to palpation.  No evidence of compartment syndrome. Dressing/Incision - Bulky dressing intact to the left leg. Motor Function - intact, moving foot and toes well on exam.  Abdomen soft with normal bowel sounds.  History reviewed. No pertinent past medical history.  Assessment/Plan: 1 Day Post-Op Procedure(s) (LRB): OPEN REDUCTION INTERNAL FIXATION (ORIF) TIBIAL PLATEAU (Left) Active Problems:   Closed  fracture of lateral portion of left tibial plateau  Estimated body mass index is 24.37 kg/m as calculated from the following:   Height as of this encounter: 5\' 6"  (1.676 m).   Weight as of this encounter: 68.5 kg. Advance diet Up with therapy D/C IV fluids when tolerating po intake.  Vitals reviewed this AM. Up with therapy today. Plan for discharge after completion of PT.  DVT Prophylaxis - Lovenox, Foot Pumps and TED hose Non-weightbearing to the left leg.  Raquel James, PA-C [facility] Orthopaedic Surgery 08/22/2018, 7:41 AM

## 2018-08-22 NOTE — Discharge Summary (Signed)
Physician Discharge Summary  Patient ID: Marvin Roberts MRN: [id number] DOB/AGE: 27/06/1990 27 y.o.  Admit date: 08/21/2018 Discharge date: 08/22/2018  Admission Diagnoses:  Fall, initial encounter [W19.XXXA] Closed fracture of left tibial plateau, initial encounter [S82.142A]  Discharge Diagnoses: Patient Active Problem List   Diagnosis Date Noted  . Closed fracture of lateral portion of left tibial plateau 08/21/2018    History reviewed. No pertinent past medical history.   Transfusion: None.   Consultants (if any):   Discharged Condition: Improved  Hospital Course: Marvin Roberts is an 28 y.o. male who was admitted 08/21/2018 with a diagnosis of a left displaced lateral tibial plateau fracture and went to the operating room on 08/21/2018 and underwent the above named procedures.    Surgeries: Procedure(s): OPEN REDUCTION INTERNAL FIXATION (ORIF) TIBIAL PLATEAU on 08/21/2018 Patient tolerated the surgery well. Taken to PACU where she was stabilized and then transferred to the orthopedic floor.  Started on Lovenox 40mg  q 24 hrs. Foot pumps applied bilaterally at 80 mm. Heels elevated on bed with rolled towels. No evidence of DVT. Negative Homan. Physical therapy started on day #1 for gait training and transfer. OT started day #1 for ADL and assisted devices.  Patient's IV was removed on POD1.  Implants: Biomet lateral tibial buttress plate with multiple screws  He was given perioperative antibiotics:  Anti-infectives (From admission, onward)   Start     Dose/Rate Route Frequency Ordered Stop   08/21/18 1652  ceFAZolin (ANCEF) IVPB 2g/100 mL premix     2 g 200 mL/hr over 30 Minutes Intravenous Every 6 hours 08/21/18 1331 08/22/18 0600   08/21/18 0945  ceFAZolin (ANCEF) IVPB 2g/100 mL premix     2 g 200 mL/hr over 30 Minutes Intravenous  Once 08/21/18 0940 08/21/18 1112    .  He was given sequential compression devices, early ambulation, and Lovenox for DVT  prophylaxis.  He benefited maximally from the hospital stay and there were no complications.    Recent vital signs:  Vitals:   08/22/18 0411 08/22/18 0736  BP: 140/84 (!) 141/99  Pulse: (!) 107 95  Resp: 19   Temp: 98.6 F (37 C) 98.3 F (36.8 C)  SpO2: 98% 99%    Recent laboratory studies:  Lab Results  Component Value Date   HGB 14.6 08/21/2018   Lab Results  Component Value Date   WBC 9.3 08/21/2018   PLT 189 08/21/2018   No results found for: INR Lab Results  Component Value Date   NA 137 08/21/2018   K 3.7 08/21/2018   CL 103 08/21/2018   CO2 25 08/21/2018   BUN 8 08/21/2018   CREATININE 1.03 08/21/2018   GLUCOSE 104 (H) 08/21/2018    Discharge Medications:   Allergies as of 08/22/2018      Reactions   Coconut Oil Rash      Medication List    TAKE these medications   aspirin EC 325 MG tablet Take 1 tablet (325 mg total) by mouth daily.   HYDROcodone-acetaminophen 5-325 MG tablet Commonly known as: NORCO/VICODIN Take 1-2 tablets by mouth every 6 (six) hours as needed for moderate pain.   traMADol 50 MG tablet Commonly known as: ULTRAM Take 1 tablet (50 mg total) by mouth every 6 (six) hours.       Diagnostic Studies: Dg Tibia/fibula Left  Result Date: 08/21/2018 CLINICAL DATA:  ORIF left tibia. EXAM: DG C-ARM 61-120 MIN; LEFT TIBIA AND FIBULA - 2 VIEW COMPARISON:  Earlier same day. FINDINGS:  Examination demonstrates internal fixation with lateral plate and screws fixating patient's lateral tibial plateau fracture. Hardware is intact with anatomic alignment over the fracture site. Note that the most inferior screw projects 4 mm medial to the medial tibial cortex. Remainder of the exam is unchanged. IMPRESSION: Internal fixation of lateral tibial plateau fracture with hardware intact and anatomic alignment over the fracture site. Electronically Signed   By: Marin Olp M.D.   On: 08/21/2018 13:25   Dg Tibia/fibula Left  Result Date:  08/21/2018 CLINICAL DATA:  Left knee pain after slip on water and fall. EXAM: LEFT TIBIA AND FIBULA - 2 VIEW COMPARISON:  None. FINDINGS: Lateral tibial plateau fracture better assessed on concurrent knee exam. Distal tibia is intact. No evidence of fibular fracture. Soft tissue edema about the knee and proximal aspect of the lower leg. IMPRESSION: Lateral tibial plateau fracture. Distal tibia and fibula are intact. Electronically Signed   By: Keith Rake M.D.   On: 08/21/2018 03:28   Ct Knee Left Wo Contrast  Result Date: 08/21/2018 CLINICAL DATA:  Initial evaluation for acute injury, fracture. EXAM: CT OF THE LEFT KNEE WITHOUT CONTRAST TECHNIQUE: Multidetector CT imaging of the left knee was performed according to the standard protocol. Multiplanar CT image reconstructions were also generated. COMPARISON:  Prior radiograph from earlier the same day. FINDINGS: Bones/Joint/Cartilage Acute comminuted fracture involving the lateral tibial plateau with intra-articular extension. Associated mild 3 mm of depression. Extension through the intercondylar eminences. Medial plateau intact. Distal femur and femoral condyles intact. Patella intact. Proximal fibula and fibular head intact. No discrete osseous lesions. Ligaments Suboptimally assessed by CT. Muscles and Tendons No visible acute muscular injury. Linear calcific density seen along the tendinous insertion for the tibialis anterior muscle. Soft tissues Diffuse soft tissue swelling adjacent to the lateral femoral tibial joint space compartment. Large joint effusion partially visualized. IMPRESSION: Acute comminuted and mildly depressed lateral tibial plateau fracture as above. Electronically Signed   By: Jeannine Boga M.D.   On: 08/21/2018 04:52   Dg Knee Complete 4 Views Left  Result Date: 08/21/2018 CLINICAL DATA:  Left knee pain after slip on water and fall. EXAM: LEFT KNEE - COMPLETE 4+ VIEW COMPARISON:  None. FINDINGS: Comminuted displaced  lateral tibial plateau fracture. Fracture extends to the tibial spine. No significant metaphyseal component demonstrated radiographically. No additional fracture. Large knee joint effusion. IMPRESSION: Comminuted displaced lateral tibial plateau fracture. Electronically Signed   By: Keith Rake M.D.   On: 08/21/2018 03:29   Dg C-arm 1-60 Min  Result Date: 08/21/2018 CLINICAL DATA:  ORIF left tibia. EXAM: DG C-ARM 61-120 MIN; LEFT TIBIA AND FIBULA - 2 VIEW COMPARISON:  Earlier same day. FINDINGS: Examination demonstrates internal fixation with lateral plate and screws fixating patient's lateral tibial plateau fracture. Hardware is intact with anatomic alignment over the fracture site. Note that the most inferior screw projects 4 mm medial to the medial tibial cortex. Remainder of the exam is unchanged. IMPRESSION: Internal fixation of lateral tibial plateau fracture with hardware intact and anatomic alignment over the fracture site. Electronically Signed   By: Marin Olp M.D.   On: 08/21/2018 13:25   Disposition: Plan for discharge back into custody today pending progress with PT this morning.  Follow-up Information    Hessie Knows, MD Follow up in 14 day(s).   Specialty: Orthopedic Surgery Why: Staple Removal. Contact information: [address] [place] [address] [phone]  Signed: Meriel PicaJames L  PA-C 08/22/2018, 7:47 AM

## 2018-08-22 NOTE — Evaluation (Signed)
Physical Therapy Evaluation Patient Details Name: Marvin Roberts MRN: [id number] DOB: 15-May-1990 Today's Date: 08/22/2018   History of Present Illness  Pt admitted for L tibial plateau fx secondary to slipping on water causing fall. Pt is now s/p ORIF of tibial plateau. Pt currently incarcerated, escorted by two police officers and currently handcuffed to bed. No PMH history on file  Clinical Impression  Pt is a pleasant 28 year old male who was admitted for L tibial plateau fx s/p ORIF. Pt performs bed mobility, transfers, and ambulation with cga and use of B crutches. Pt familiar with crutches, demonstrated technique prior to performance. Pt reports pain with all mobility. Pt does not require any further PT needs at this time. Pt will be dc in house and does not require follow up. RN aware. Will dc current orders.     Follow Up Recommendations No PT follow up    Equipment Recommendations  Crutches(set to 5'6")    Recommendations for Other Services       Precautions / Restrictions Precautions Precautions: Fall Restrictions Weight Bearing Restrictions: Yes LLE Weight Bearing: Non weight bearing      Mobility  Bed Mobility Overal bed mobility: Needs Assistance Bed Mobility: Supine to Sit     Supine to sit: Min guard     General bed mobility comments: cued for hooking under surgical leg. Once seated, able to sit with upright posture.  Transfers Overall transfer level: Needs assistance Equipment used: Crutches Transfers: Sit to/from Stand Sit to Stand: Min guard         General transfer comment: transfers performed with B crutches. Safe technique. Able to maintain correct WBing once standing  Ambulation/Gait Ambulation/Gait assistance: Min guard Gait Distance (Feet): 40 Feet Assistive device: Crutches Gait Pattern/deviations: Step-to pattern     General Gait Details: hop on R LE in room. Requests not to ambulate in hallway due to intense pain with hopping. Safe  technique with no LOB. Demonstration for crutches given prior to performance  Stairs Stairs: (pt refused to do stairs)          Wheelchair Mobility    Modified Rankin (Stroke Patients Only)       Balance Overall balance assessment: History of Falls                                           Pertinent Vitals/Pain Pain Assessment: 0-10 Pain Score: 7  Pain Location: L leg distal to knee Pain Descriptors / Indicators: Operative site guarding Pain Intervention(s): Limited activity within patient's tolerance;Premedicated before session;Ice applied    Home Living Family/patient expects to be discharged to:: [facility]                 Additional Comments: per police, pt may be able to stay on first level in medical side. No stairs for entry    Prior Function Level of Independence: Independent               Hand Dominance        Extremity/Trunk Assessment   Upper Extremity Assessment Upper Extremity Assessment: Overall WFL for tasks assessed    Lower Extremity Assessment Lower Extremity Assessment: Generalized weakness(L LE grossly 3/5; R LE grossly 5/5)       Communication   Communication: No difficulties  Cognition Arousal/Alertness: Awake/alert Behavior During Therapy: WFL for tasks assessed/performed Overall Cognitive Status: Within Functional Limits for  tasks assessed                                        General Comments      Exercises Other Exercises Other Exercises: supine ther-ex performed on B LE including AP, SLRs, and hip abd/add. R LE continued with SAQ. All ther-ex performed x 10 reps with cga. Safe technique   Assessment/Plan    PT Assessment Patent does not need any further PT services  PT Problem List         PT Treatment Interventions      PT Goals (Current goals can be found in the Care Plan section)  Acute Rehab PT Goals Patient Stated Goal: to leave the hospital PT Goal  Formulation: All assessment and education complete, DC therapy Time For Goal Achievement: 08/22/18 Potential to Achieve Goals: Good    Frequency     Barriers to discharge        Co-evaluation               AM-PAC PT "6 Clicks" Mobility  Outcome Measure Help needed turning from your back to your side while in a flat bed without using bedrails?: A Little Help needed moving from lying on your back to sitting on the side of a flat bed without using bedrails?: A Little Help needed moving to and from a bed to a chair (including a wheelchair)?: A Little Help needed standing up from a chair using your arms (e.g., wheelchair or bedside chair)?: A Little Help needed to walk in hospital room?: A Little Help needed climbing 3-5 steps with a railing? : A Little 6 Click Score: 18    End of Session Equipment Utilized During Treatment: Gait belt Activity Tolerance: Patient tolerated treatment well Patient left: in bed(left with police and pt handcuffed to bed) Nurse Communication: Mobility status PT Visit Diagnosis: Muscle weakness (generalized) (M62.81);History of falling (Z91.81);Difficulty in walking, not elsewhere classified (R26.2);Pain Pain - Right/Left: Left Pain - part of body: Leg    Time: [id number] PT Time Calculation (min) (ACUTE ONLY): 27 min   Charges:   PT Evaluation $PT Eval Low Complexity: 1 Low PT Treatments $Therapeutic Exercise: 8-22 mins        Elizabeth PalauStephanie Brendyn Mclaren, PT, DPT [phone]   Marlia Schewe 08/22/2018, 11:04 AM

## 2018-08-22 NOTE — Progress Notes (Signed)
Discharge instructions reviewed with patient and sheriff. Verbalized understanding. IV removed. Patient left with sheriff and NT to medical mall.

## 2018-08-22 NOTE — Discharge Instructions (Signed)
Diet: As you were doing prior to hospitalization   Shower:  May shower but keep the wounds dry, use an occlusive plastic wrap, NO SOAKING IN TUB.  If the bandage gets wet, change with a clean dry gauze.  Dressing:  You may change your dressing as needed. Change the dressing with sterile gauze dressing.    Activity:  Increase activity slowly as tolerated, but follow the weight bearing instructions below.  No lifting or driving for 6 weeks.  Weight Bearing:   Non weightbearing to the left leg.  Blood Clot Prevention: Continue to pump ankle up and down routinely each day.  Take 1 325mg  aspirin daily for DVT prevention.  To prevent constipation: you may use a stool softener such as -  Colace (over the counter) 100 mg by mouth twice a day  Drink plenty of fluids (prune juice may be helpful) and high fiber foods Miralax (over the counter) for constipation as needed.    Itching:  If you experience itching with your medications, try taking only a single pain pill, or even half a pain pill at a time.  You may take up to 10 pain pills per day, and you can also use benadryl over the counter for itching or also to help with sleep.   Precautions:  If you experience chest pain or shortness of breath - call 911 immediately for transfer to the hospital emergency department!!  If you develop a fever greater that 101 F, purulent drainage from wound, increased redness or drainage from wound, or calf pain-Call [place]                                              Follow- Up Appointment:  Please call for an appointment to be seen in 2 weeks at [facility]

## 2018-08-22 NOTE — Plan of Care (Signed)
  Problem: Health Behavior/Discharge Planning: Goal: Ability to manage health-related needs will improve Outcome: Progressing   Problem: Clinical Measurements: Goal: Ability to maintain clinical measurements within normal limits will improve Outcome: Progressing Goal: Will remain free from infection Outcome: Progressing Goal: Cardiovascular complication will be avoided Outcome: Progressing   Problem: Activity: Goal: Risk for activity intolerance will decrease Outcome: Progressing   Problem: Nutrition: Goal: Adequate nutrition will be maintained Outcome: Progressing   Problem: Coping: Goal: Level of anxiety will decrease Outcome: Progressing   Problem: Elimination: Goal: Will not experience complications related to bowel motility Outcome: Progressing Goal: Will not experience complications related to urinary retention Outcome: Progressing   Problem: Pain Managment: Goal: General experience of comfort will improve Outcome: Progressing   Problem: Safety: Goal: Ability to remain free from injury will improve Outcome: Progressing   Problem: Skin Integrity: Goal: Risk for impaired skin integrity will decrease Outcome: Progressing   

## 2018-08-22 NOTE — Progress Notes (Signed)
Patient laying quietly in bed, rates his pain 7/10, B/P elevated 147/102 Gardiner Barefoot NP contacted. New orders given.

## 2018-08-22 NOTE — TOC Transition Note (Signed)
Transition of Care [facility]) - Progression Note    Patient Details  Name: Marvin Roberts MRN: [id number] Date of Birth: [birth year]  [facility] CM/SW Contact  Jacoby Ritsema, Lenice Llamas Phone Number: [phone]  08/22/2018, 9:55 AM  Clinical Narrative:  Patient will D/C today back into police today. Per PT patient needs crutches. [employer] agency representative is aware of above. Please reconsult if future social work needs arise. CSW signing off.          Expected Discharge Plan and Services           Expected Discharge Date: 08/22/18                                     Social Determinants of Health (SDOH) Interventions    Readmission Risk Interventions No flowsheet data found.

## 2018-08-23 ENCOUNTER — Inpatient Hospital Stay: Admission: EM | Admit: 2018-08-23 | Discharge: 2018-08-30 | DRG: 558 | Attending: Specialist | Admitting: Specialist

## 2018-08-23 ENCOUNTER — Emergency Department

## 2018-08-23 ENCOUNTER — Other Ambulatory Visit: Payer: Self-pay

## 2018-08-23 DIAGNOSIS — M6282 Rhabdomyolysis: Principal | ICD-10-CM | POA: Diagnosis present

## 2018-08-23 DIAGNOSIS — F17200 Nicotine dependence, unspecified, uncomplicated: Secondary | ICD-10-CM | POA: Diagnosis present

## 2018-08-23 DIAGNOSIS — E869 Volume depletion, unspecified: Secondary | ICD-10-CM | POA: Diagnosis present

## 2018-08-23 DIAGNOSIS — T796XXA Traumatic ischemia of muscle, initial encounter: Secondary | ICD-10-CM

## 2018-08-23 DIAGNOSIS — G8918 Other acute postprocedural pain: Secondary | ICD-10-CM | POA: Diagnosis present

## 2018-08-23 DIAGNOSIS — Z7982 Long term (current) use of aspirin: Secondary | ICD-10-CM

## 2018-08-23 DIAGNOSIS — M79605 Pain in left leg: Secondary | ICD-10-CM

## 2018-08-23 DIAGNOSIS — I10 Essential (primary) hypertension: Secondary | ICD-10-CM | POA: Diagnosis present

## 2018-08-23 DIAGNOSIS — Z79899 Other long term (current) drug therapy: Secondary | ICD-10-CM

## 2018-08-23 DIAGNOSIS — Z20828 Contact with and (suspected) exposure to other viral communicable diseases: Secondary | ICD-10-CM | POA: Diagnosis present

## 2018-08-23 LAB — CBC WITH DIFFERENTIAL/PLATELET
Abs Immature Granulocytes: 0.03 10*3/uL (ref 0.00–0.07)
Basophils Absolute: 0 10*3/uL (ref 0.0–0.1)
Basophils Relative: 0 %
Eosinophils Absolute: 0.1 10*3/uL (ref 0.0–0.5)
Eosinophils Relative: 1 %
HCT: 43.2 % (ref 39.0–52.0)
Hemoglobin: 14.2 g/dL (ref 13.0–17.0)
Immature Granulocytes: 0 %
Lymphocytes Relative: 14 %
Lymphs Abs: 1.2 10*3/uL (ref 0.7–4.0)
MCH: 30.1 pg (ref 26.0–34.0)
MCHC: 32.9 g/dL (ref 30.0–36.0)
MCV: 91.5 fL (ref 80.0–100.0)
Monocytes Absolute: 0.9 10*3/uL (ref 0.1–1.0)
Monocytes Relative: 10 %
Neutro Abs: 6.4 10*3/uL (ref 1.7–7.7)
Neutrophils Relative %: 75 %
Platelets: 195 10*3/uL (ref 150–400)
RBC: 4.72 MIL/uL (ref 4.22–5.81)
RDW: 13.9 % (ref 11.5–15.5)
WBC: 8.6 10*3/uL (ref 4.0–10.5)
nRBC: 0 % (ref 0.0–0.2)

## 2018-08-23 LAB — COMPREHENSIVE METABOLIC PANEL
ALT: 62 U/L — ABNORMAL HIGH (ref 0–44)
AST: 288 U/L — ABNORMAL HIGH (ref 15–41)
Albumin: 4.6 g/dL (ref 3.5–5.0)
Alkaline Phosphatase: 48 U/L (ref 38–126)
Anion gap: 9 (ref 5–15)
BUN: 9 mg/dL (ref 6–20)
CO2: 30 mmol/L (ref 22–32)
Calcium: 9.4 mg/dL (ref 8.9–10.3)
Chloride: 100 mmol/L (ref 98–111)
Creatinine, Ser: 0.95 mg/dL (ref 0.61–1.24)
GFR calc Af Amer: >60 mL/min (ref >60)
GFR calc non Af Amer: >60 mL/min (ref >60)
Glucose, Bld: 89 mg/dL (ref 70–99)
Potassium: 4.5 mmol/L (ref 3.5–5.1)
Sodium: 139 mmol/L (ref 135–145)
Total Bilirubin: 0.9 mg/dL (ref 0.3–1.2)
Total Protein: 8.2 g/dL — ABNORMAL HIGH (ref 6.5–8.1)

## 2018-08-23 LAB — HIV ANTIBODY (ROUTINE TESTING W REFLEX): HIV Screen 4th Generation wRfx: NONREACTIVE

## 2018-08-23 LAB — TYPE AND SCREEN
ABO/RH(D): A NEG
Antibody Screen: NEGATIVE

## 2018-08-23 LAB — LACTIC ACID, PLASMA: Lactic Acid, Venous: 1.1 mmol/L (ref 0.5–1.9)

## 2018-08-23 LAB — SARS CORONAVIRUS 2 BY RT PCR (HOSPITAL ORDER, PERFORMED IN ~~LOC~~ HOSPITAL LAB): SARS Coronavirus 2: NEGATIVE

## 2018-08-23 MED ORDER — ONDANSETRON HCL 4 MG/2ML IJ SOLN
4.0000 mg | Freq: Once | INTRAMUSCULAR | Status: AC
  Administered 2018-08-23: 20:00:00 4 mg via INTRAVENOUS
  Filled 2018-08-23: qty 2

## 2018-08-23 MED ORDER — MORPHINE SULFATE (PF) 4 MG/ML IV SOLN
4.0000 mg | INTRAVENOUS | Status: DC | PRN
  Administered 2018-08-23 (×2): 4 mg via INTRAVENOUS
  Filled 2018-08-23 (×2): qty 1

## 2018-08-23 MED ORDER — ACETAMINOPHEN 500 MG PO TABS
1000.0000 mg | ORAL_TABLET | Freq: Once | ORAL | Status: AC
  Administered 2018-08-23: 21:00:00 1000 mg via ORAL
  Filled 2018-08-23: qty 2

## 2018-08-23 MED ORDER — CEFAZOLIN SODIUM-DEXTROSE 1-4 GM/50ML-% IV SOLN
1.0000 g | Freq: Once | INTRAVENOUS | Status: DC
  Filled 2018-08-23: qty 50

## 2018-08-23 MED ORDER — OXYCODONE-ACETAMINOPHEN 5-325 MG PO TABS
1.0000 | ORAL_TABLET | Freq: Once | ORAL | Status: AC
  Administered 2018-08-23: 22:00:00 1 via ORAL
  Filled 2018-08-23: qty 1

## 2018-08-23 MED ORDER — SODIUM CHLORIDE 0.9 % IV BOLUS
1000.0000 mL | Freq: Once | INTRAVENOUS | Status: AC
  Administered 2018-08-23: 21:00:00 1000 mL via INTRAVENOUS

## 2018-08-23 MED ORDER — SODIUM CHLORIDE 0.9 % IV BOLUS
500.0000 mL | Freq: Once | INTRAVENOUS | Status: AC
  Administered 2018-08-23: 500 mL via INTRAVENOUS

## 2018-08-23 MED ORDER — SODIUM CHLORIDE 0.9 % IV BOLUS
1000.0000 mL | Freq: Once | INTRAVENOUS | Status: AC
  Administered 2018-08-23: 22:00:00 1000 mL via INTRAVENOUS

## 2018-08-23 NOTE — ED Notes (Signed)
ED Provider at bedside. 

## 2018-08-23 NOTE — ED Provider Notes (Signed)
-----------------------------------------   10:40 PM on 08/23/2018 -----------------------------------------  I took over care on this patient from Dr. Quentin Cornwall.  The patient presented with left knee swelling after recent surgery and also had tachycardia.  Dr. Marry Guan from orthopedics has evaluated the patient and cleaned the surgical incision site.  He advises that there is no evidence of compartment syndrome or joint infection.  He recommends continued elevation, cold therapy, and pain control with Vicodin as the patient had been prescribed.  He feels that the patient's tachycardia is likely related to pain.  On reassessment, the patient appears comfortable.  His heart rate is coming down, now to about 115 when I was in the room.  Based on the clinical picture and the reassuring lab work-up, there is no evidence of systemic infection or sepsis.  I anticipate that the patient will be appropriate for discharge back to his jail facility.  I have ordered some additional fluid and analgesia.  Plan will be discharge home after this.    Arta Silence, MD 08/23/18 2242

## 2018-08-23 NOTE — ED Notes (Signed)
Iv meds given for pain.  siderails up x 2.  Guard at door.

## 2018-08-23 NOTE — ED Notes (Signed)
EKG performed.

## 2018-08-23 NOTE — ED Triage Notes (Addendum)
Pt arrived via [facility] under arrest with left knee surgery Sunday. Knee has been bleeding with no resolve, has been changed twice with jail staff, there is blood on wrap on arrival.

## 2018-08-23 NOTE — ED Notes (Signed)
Report off to katy rn  

## 2018-08-23 NOTE — ED Provider Notes (Addendum)
[facility] Emergency Department Provider Note    First MD Initiated Contact with Patient 08/23/18 1912     (approximate)  I have reviewed the triage vital signs and the nursing notes.   HISTORY  Chief Complaint Knee Pain    HPI Marvin Roberts is a 28 y.o. male recently postop left tibial ORIF presents to the ER for worsening left leg pain and drainage.  [facility].  Not currently on any antibiotics.  States the pain is 10 of 10.  Is only been able to take Tylenol 3.  Has not noticed any purulent drainage.  Is not any blood thinners.  No numbness or tingling.    History reviewed. No pertinent past medical history. History reviewed. No pertinent family history. Past Surgical History:  Procedure Laterality Date  . KNEE SURGERY  08/21/2018  . ORIF TIBIA PLATEAU Left 08/21/2018   Procedure: OPEN REDUCTION INTERNAL FIXATION (ORIF) TIBIAL PLATEAU;  Surgeon: Kennedy BuckerMenz, Michael, MD;  Location: [facility] ORS;  Service: Orthopedics;  Laterality: Left;   Patient Active Problem List   Diagnosis Date Noted  . Closed fracture of lateral portion of left tibial plateau 08/21/2018      Prior to Admission medications   Medication Sig Start Date End Date Taking? Authorizing Provider  aspirin EC 325 MG tablet Take 1 tablet (325 mg total) by mouth daily. 08/22/18  Yes Anson OregonMcGhee, James Lance, PA-C  HYDROcodone-acetaminophen (NORCO/VICODIN) 5-325 MG tablet Take 1-2 tablets by mouth every 6 (six) hours as needed for moderate pain. 08/22/18  Yes Anson OregonMcGhee, James Lance, PA-C  traMADol (ULTRAM) 50 MG tablet Take 1 tablet (50 mg total) by mouth every 6 (six) hours. 08/22/18  Yes Anson OregonMcGhee, James Lance, PA-C    Allergies Coconut oil    Social History Social History   Tobacco Use  . Smoking status: Current Every Day Smoker  . Smokeless tobacco: Never Used  Substance Use Topics  . Alcohol use: Not on file  . Drug use: Not on file    Review of Systems Patient denies  headaches, rhinorrhea, blurry vision, numbness, shortness of breath, chest pain, edema, cough, abdominal pain, nausea, vomiting, diarrhea, dysuria, fevers, rashes or hallucinations unless otherwise stated above in HPI. ____________________________________________   PHYSICAL EXAM:  VITAL SIGNS: Vitals:   08/23/18 1921 08/23/18 1922  BP:  (!) 146/97  Pulse: (!) 136 (!) 127  Resp:  20  Temp:  99.9 F (37.7 C)  SpO2: 100%     Constitutional: Alert and oriented.  Eyes: Conjunctivae are normal.  Head: Atraumatic. Nose: No congestion/rhinnorhea. Mouth/Throat: Mucous membranes are moist.   Neck: No stridor. Painless ROM.  Cardiovascular: tachycardic rate, regular rhythm. Grossly normal heart sounds.  Good peripheral circulation. Respiratory: Normal respiratory effort.  No retractions. Lungs CTAB. Gastrointestinal: Soft and nontender. No distention. No abdominal bruits. No CVA tenderness. Genitourinary:  Musculoskeletal: Left lateral knee incision does have oozing blood.  No purulence.  No overt cellulitis or erythema.  The anterior compartment is very tender along the entirety of the compartment.  Posterior compartment soft and nontender.   Neurologic:  Normal speech and language. No gross focal neurologic deficits are appreciated. No facial droop Skin:  Skin is warm, dry and intact. No rash noted. Psychiatric: Mood and affect are normal. Speech and behavior are normal.  ____________________________________________   LABS (all labs ordered are listed, but only abnormal results are displayed)  Results for orders placed or performed during the hospital encounter of 08/23/18 (from the past  24 hour(s))  Lactic acid, plasma     Status: None   Collection Time: 08/23/18  7:35 PM  Result Value Ref Range   Lactic Acid, Venous 1.1 0.5 - 1.9 mmol/L  Comprehensive metabolic panel     Status: Abnormal   Collection Time: 08/23/18  7:35 PM  Result Value Ref Range   Sodium 139 135 - 145 mmol/L    Potassium 4.5 3.5 - 5.1 mmol/L   Chloride 100 98 - 111 mmol/L   CO2 30 22 - 32 mmol/L   Glucose, Bld 89 70 - 99 mg/dL   BUN 9 6 - 20 mg/dL   Creatinine, Ser 4.090.95 0.61 - 1.24 mg/dL   Calcium 9.4 8.9 - 81.110.3 mg/dL   Total Protein 8.2 (H) 6.5 - 8.1 g/dL   Albumin 4.6 3.5 - 5.0 g/dL   AST 914288 (H) 15 - 41 U/L   ALT 62 (H) 0 - 44 U/L   Alkaline Phosphatase 48 38 - 126 U/L   Total Bilirubin 0.9 0.3 - 1.2 mg/dL   GFR calc non Af Amer >60 >60 mL/min   GFR calc Af Amer >60 >60 mL/min   Anion gap 9 5 - 15  CBC WITH DIFFERENTIAL     Status: None   Collection Time: 08/23/18  7:35 PM  Result Value Ref Range   WBC 8.6 4.0 - 10.5 K/uL   RBC 4.72 4.22 - 5.81 MIL/uL   Hemoglobin 14.2 13.0 - 17.0 g/dL   HCT 78.243.2 95.639.0 - 21.352.0 %   MCV 91.5 80.0 - 100.0 fL   MCH 30.1 26.0 - 34.0 pg   MCHC 32.9 30.0 - 36.0 g/dL   RDW 08.613.9 57.811.5 - 46.915.5 %   Platelets 195 150 - 400 K/uL   nRBC 0.0 0.0 - 0.2 %   Neutrophils Relative % 75 %   Neutro Abs 6.4 1.7 - 7.7 K/uL   Lymphocytes Relative 14 %   Lymphs Abs 1.2 0.7 - 4.0 K/uL   Monocytes Relative 10 %   Monocytes Absolute 0.9 0.1 - 1.0 K/uL   Eosinophils Relative 1 %   Eosinophils Absolute 0.1 0.0 - 0.5 K/uL   Basophils Relative 0 %   Basophils Absolute 0.0 0.0 - 0.1 K/uL   Immature Granulocytes 0 %   Abs Immature Granulocytes 0.03 0.00 - 0.07 K/uL  Type and screen [facility]     Status: None (Preliminary result)   Collection Time: 08/23/18  7:45 PM  Result Value Ref Range   ABO/RH(D) PENDING    Antibody Screen PENDING    Sample Expiration      08/26/2018,2359 Performed at [facility], [address] [place], [place] [id number]    ____________________________________________ ____________________________________________  RADIOLOGY  I personally reviewed all radiographic images ordered to evaluate for the above acute complaints and reviewed radiology reports and findings.  These findings were personally discussed with  the patient.  Please see medical record for radiology report.  ____________________________________________   PROCEDURES  Procedure(s) performed:  Procedures    Critical Care performed: no ____________________________________________   INITIAL IMPRESSION / ASSESSMENT AND PLAN / ED COURSE  Pertinent labs & imaging results that were available during my care of the patient were reviewed by me and considered in my medical decision making (see chart for details).   DDX: Hematoma, swelling, compartment syndrome, neck Fash, cellulitis, septic arthritis, fracture, foreign body  Demyan Jimmey Roberts is a 28 y.o. who presents to the ED with symptoms as described above.  Patient with low-grade temperature and is tachycardic.  Will give IV fluids as well as IV pain medication.  X-ray will be ordered.  Patient very tender on exam will order blood work for the by differential.    Clinical Course as of Aug 23 2043  Tue Aug 23, 2018  1953 IV pain meds ordered.  Have consulted ortho.   [PR]  2010 AST is mildly elevated concerning for some muscle breakdown which may be expected postop..  Will add on CK.  Lactate is normal.   [PR]  2023 Hemoglobin: 14.2 [PR]  2024 Discussed case with Dr. Marry Guan of orthopedics who agrees to evaluate patient at bedside.  Has requested we hold off on Abx until his examination.  As he is not septic I think this is reasonable.  We will continue with IV fluids IV pain medication.  Will keep leg elevated.  Given lack of white count seems less likely sepsis or infection.  Patient adamantly denies any interval trauma to incite or precipitate compartment syndrome.   [PR]    Clinical Course User Index [PR] Merlyn Lot, MD    The patient was evaluated in Emergency Department today for the symptoms described in the history of present illness. He/she was evaluated in the context of the global COVID-19 pandemic, which necessitated consideration that the patient might be at risk  for infection with the SARS-CoV-2 virus that causes COVID-19. Institutional protocols and algorithms that pertain to the evaluation of patients at risk for COVID-19 are in a state of rapid change based on information released by regulatory bodies including the CDC and federal and state organizations. These policies and algorithms were followed during the patient's care in the ED.  As part of my medical decision making, I reviewed the following data within the [place] notes reviewed and incorporated, Labs reviewed, notes from prior ED visits and [place] Controlled Substance Database   ____________________________________________   FINAL CLINICAL IMPRESSION(S) / ED DIAGNOSES  Final diagnoses:  Left leg pain  Acute post-operative pain      NEW MEDICATIONS STARTED DURING THIS VISIT:  New Prescriptions   No medications on file     Note:  This document was prepared using Dragon voice recognition software and may include unintentional dictation errors.        Merlyn Lot, MD 08/23/18 2045

## 2018-08-23 NOTE — Discharge Instructions (Addendum)
The knee was evaluated by the orthopedist.  We recommend:  - Continued hydrocodone/acetaminophen for pain - Elevation of the leg above the level of the heart - Continued Polar Care or other cold therapy to the knee  - If he cannot continue with elevation and cold therapy at his current incarceration site, he should be transferred to a prison medical facility for a short time for better control of the swelling.   Return to the ER for new or worsening pain, swelling, fever or any other new or worsening symptoms that concern you.

## 2018-08-23 NOTE — ED Notes (Signed)
Pt is prisoner from jail   Pt fell 3 days ago and fx left knee.  Pt treated at [facility] and had surgery by dr Rudene Christians.  Pt has staples in left knee.   Pt reports bleeding since yesterday and pain.  Swelling to left knee.  Pt alert.  Guard with pt and pt is handcuffed to stretcher by guard.

## 2018-08-23 NOTE — Consult Note (Addendum)
ORTHOPAEDIC CONSULTATION  PATIENT NAME: Marvin Roberts DOB: 26-Mar-1990  MRN: [id number]  REQUESTING PHYSICIAN: Merlyn Lot, MD  Chief Complaint: Left leg pain and drainage  HPI: Marvin Roberts is a 28 y.o. male who complains of severe left knee and leg pain as well as drainage from his surgical site.  The patient underwent open reduction internal fixation of a left lateral tibial plateau fracture on 08/21/2018 as per Dr. Gordy Levan.  He is incarcerated and was discharged to the jail facility with a Polar Care.  He is apparently been receiving only Tylenol 3 for pain control.  He states that he has not been able to keep the left lower extremity elevated.  He has had increased swelling to the left knee and lower leg with increased pain.  He denies any new injuries to the knee.  He has been nonweightbearing.  History reviewed. No pertinent past medical history. Past Surgical History:  Procedure Laterality Date  . KNEE SURGERY  08/21/2018  . ORIF TIBIA PLATEAU Left 08/21/2018   Procedure: OPEN REDUCTION INTERNAL FIXATION (ORIF) TIBIAL PLATEAU;  Surgeon: Hessie Knows, MD;  Location: [facility] ORS;  Service: Orthopedics;  Laterality: Left;   Social History   Socioeconomic History  . Marital status: Single    Spouse name: Not on file  . Number of children: Not on file  . Years of education: Not on file  . Highest education level: Not on file  Occupational History  . Not on file  Social Needs  . Financial resource strain: Not on file  . Food insecurity    Worry: Not on file    Inability: Not on file  . Transportation needs    Medical: Not on file    Non-medical: Not on file  Tobacco Use  . Smoking status: Current Every Day Smoker  . Smokeless tobacco: Never Used  Substance and Sexual Activity  . Alcohol use: Not on file  . Drug use: Not on file  . Sexual activity: Not on file  Lifestyle  . Physical activity    Days per week: Not on file    Minutes per session: Not on file  .  Stress: Not on file  Relationships  . Social Herbalist on phone: Not on file    Gets together: Not on file    Attends religious service: Not on file    Active member of club or organization: Not on file    Attends meetings of clubs or organizations: Not on file    Relationship status: Not on file  Other Topics Concern  . Not on file  Social History Narrative  . Not on file   History reviewed. No pertinent family history. Allergies  Allergen Reactions  . Coconut Oil Rash   Prior to Admission medications   Medication Sig Start Date End Date Taking? Authorizing Provider  aspirin EC 325 MG tablet Take 1 tablet (325 mg total) by mouth daily. 08/22/18  Yes Lattie Corns, PA-C  HYDROcodone-acetaminophen (NORCO/VICODIN) 5-325 MG tablet Take 1-2 tablets by mouth every 6 (six) hours as needed for moderate pain. 08/22/18  Yes Lattie Corns, PA-C  traMADol (ULTRAM) 50 MG tablet Take 1 tablet (50 mg total) by mouth every 6 (six) hours. 08/22/18  Yes Lattie Corns, PA-C   Dg Knee Complete 4 Views Left  Result Date: 08/23/2018 CLINICAL DATA:  Knee pain and swelling EXAM: LEFT KNEE - COMPLETE 4+ VIEW COMPARISON:  08/21/2018 FINDINGS: Lateral buttress plate and multiple screws  at the proximal RIGHT tibia post ORIF of a lateral plateau fracture. Hardware appears intact without surrounding lucency. Overlying skin clips. Scattered soft tissue swelling laterally at the distal thigh through proximal lower leg. Joint spaces preserved. Osseous mineralization normal. Small knee joint effusion. No additional fracture, dislocation or bone destruction. IMPRESSION: Post ORIF of the LEFT lateral tibial plateau. Joint effusion and scattered soft tissue swelling. No acute osseous abnormalities. Electronically Signed   By: Ulyses SouthwardMark  Boles M.D.   On: 08/23/2018 19:54    Positive ROS: All other systems have been reviewed and were otherwise negative with the exception of those mentioned in the HPI  and as above.  Physical Exam: General: Well developed, well nourished male seen in some discomfort.. Skin: The lateral based hockey-stick incision to the left knee is well approximated with surgical staples. Neurologic: Awake, alert, and oriented. Sensory function is grossly intact. Motor strength is felt to be 5 over 5 bilaterally with the exception of the left lower extremity where he demonstrates guarding.. No clonus or tremor. Good motor coordination.  MUSCULOSKELETAL: Examinationof the left knee demonstrates moderate amount of soft tissue swelling.  There appears to be some knee effusion.  He complains of diffuse pain to the entire leg.  No significant swelling to the foot or ankle.  The posterior and lateral compartments are supple.  The surgical incision is well approximated.  There is some bloody drainage from the midportion of the incision.  No erythema.  No significant increased pain with dorsiflexion of the toes or foot.  Assessment: Status post ORIF of a left lateral tibial plateau fracture.  Plan: No evidence of infection or compartment syndrome. The surgical incision was cleaned and Betadine applied.  A bulky dressing was applied to the knee and lower leg.  I have recommended elevation of the left lower extremity above the level of the heart so as to decrease swelling.  He is also to continue with Polar Care or cold therapy to the area of the knee.  The patient has apparently [facility], but has apparently been denied thus far.  If it is not possible to continue with elevation and cold therapy to the knee and lower leg at his current site of incarceration, I believe it would be reasonable to have him transferred to the prison medical facility for short period of time for both control of swelling and better pain control.  He was apparently discharged from the hospital on Vicodin, and I believe this would be appropriate given the  patient's injury.  The patient has apparently already been scheduled for follow-up with Dr. Rosita KeaMenz.  Jaylee Freeze P. Angie FavaHooten, Jr. M.D.

## 2018-08-23 NOTE — ED Notes (Signed)
Dr Marry Guan in with pt.  Left leg cleaned, wrapped with gauze and ace bandages by dr Marry Guan. Pt tolerated well.  Iv fluids infusing.

## 2018-08-24 ENCOUNTER — Other Ambulatory Visit: Payer: Self-pay

## 2018-08-24 DIAGNOSIS — F17200 Nicotine dependence, unspecified, uncomplicated: Secondary | ICD-10-CM | POA: Diagnosis present

## 2018-08-24 DIAGNOSIS — Z79899 Other long term (current) drug therapy: Secondary | ICD-10-CM | POA: Diagnosis not present

## 2018-08-24 DIAGNOSIS — I10 Essential (primary) hypertension: Secondary | ICD-10-CM | POA: Diagnosis present

## 2018-08-24 DIAGNOSIS — Z20828 Contact with and (suspected) exposure to other viral communicable diseases: Secondary | ICD-10-CM | POA: Diagnosis present

## 2018-08-24 DIAGNOSIS — E869 Volume depletion, unspecified: Secondary | ICD-10-CM | POA: Diagnosis present

## 2018-08-24 DIAGNOSIS — G8918 Other acute postprocedural pain: Secondary | ICD-10-CM | POA: Diagnosis present

## 2018-08-24 DIAGNOSIS — M6282 Rhabdomyolysis: Secondary | ICD-10-CM | POA: Diagnosis present

## 2018-08-24 DIAGNOSIS — Z7982 Long term (current) use of aspirin: Secondary | ICD-10-CM | POA: Diagnosis not present

## 2018-08-24 LAB — CBC
HCT: 36.3 % — ABNORMAL LOW (ref 39.0–52.0)
Hemoglobin: 11.6 g/dL — ABNORMAL LOW (ref 13.0–17.0)
MCH: 29.9 pg (ref 26.0–34.0)
MCHC: 32 g/dL (ref 30.0–36.0)
MCV: 93.6 fL (ref 80.0–100.0)
Platelets: 177 10*3/uL (ref 150–400)
RBC: 3.88 MIL/uL — ABNORMAL LOW (ref 4.22–5.81)
RDW: 14 % (ref 11.5–15.5)
WBC: 9.6 10*3/uL (ref 4.0–10.5)
nRBC: 0 % (ref 0.0–0.2)

## 2018-08-24 LAB — BASIC METABOLIC PANEL
Anion gap: 4 — ABNORMAL LOW (ref 5–15)
BUN: 8 mg/dL (ref 6–20)
CO2: 27 mmol/L (ref 22–32)
Calcium: 8.1 mg/dL — ABNORMAL LOW (ref 8.9–10.3)
Chloride: 109 mmol/L (ref 98–111)
Creatinine, Ser: 0.95 mg/dL (ref 0.61–1.24)
GFR calc Af Amer: >60 mL/min (ref >60)
GFR calc non Af Amer: >60 mL/min (ref >60)
Glucose, Bld: 103 mg/dL — ABNORMAL HIGH (ref 70–99)
Potassium: 4.8 mmol/L (ref 3.5–5.1)
Sodium: 140 mmol/L (ref 135–145)

## 2018-08-24 LAB — TSH: TSH: 1.14 u[IU]/mL (ref 0.350–4.500)

## 2018-08-24 LAB — LACTIC ACID, PLASMA: Lactic Acid, Venous: 1.5 mmol/L (ref 0.5–1.9)

## 2018-08-24 LAB — CK
Total CK: 15406 U/L — ABNORMAL HIGH (ref 49–397)
Total CK: 11350 U/L — ABNORMAL HIGH (ref 49–397)

## 2018-08-24 MED ORDER — MAGNESIUM HYDROXIDE 400 MG/5ML PO SUSP
30.0000 mL | Freq: Every day | ORAL | Status: DC | PRN
  Administered 2018-08-25 – 2018-08-26 (×2): 30 mL via ORAL
  Filled 2018-08-24 (×2): qty 30

## 2018-08-24 MED ORDER — ONDANSETRON HCL 4 MG PO TABS: 4.0000 mg | ORAL_TABLET | Freq: Four times a day (QID) | ORAL | Status: DC | PRN

## 2018-08-24 MED ORDER — TRAZODONE HCL 50 MG PO TABS
25.0000 mg | ORAL_TABLET | Freq: Every evening | ORAL | Status: DC | PRN
  Administered 2018-08-29 (×2): 25 mg via ORAL
  Filled 2018-08-24 (×2): qty 1

## 2018-08-24 MED ORDER — KETOROLAC TROMETHAMINE 30 MG/ML IJ SOLN
30.0000 mg | Freq: Once | INTRAMUSCULAR | Status: AC
  Administered 2018-08-24: 01:00:00 30 mg via INTRAVENOUS
  Filled 2018-08-24: qty 1

## 2018-08-24 MED ORDER — HYDROCODONE-ACETAMINOPHEN 5-325 MG PO TABS
1.0000 | ORAL_TABLET | Freq: Four times a day (QID) | ORAL | Status: DC | PRN
  Administered 2018-08-24: 2 via ORAL
  Administered 2018-08-24: 08:00:00 1 via ORAL
  Administered 2018-08-24: 22:00:00 2 via ORAL
  Administered 2018-08-25: 08:00:00 1 via ORAL
  Administered 2018-08-25 – 2018-08-30 (×11): 2 via ORAL
  Filled 2018-08-24 (×2): qty 2
  Filled 2018-08-24: qty 1
  Filled 2018-08-24: qty 2
  Filled 2018-08-24: qty 1
  Filled 2018-08-24 (×2): qty 2
  Filled 2018-08-24: qty 1
  Filled 2018-08-24 (×2): qty 2
  Filled 2018-08-24: qty 1
  Filled 2018-08-24 (×5): qty 2

## 2018-08-24 MED ORDER — ENOXAPARIN SODIUM 40 MG/0.4ML ~~LOC~~ SOLN
40.0000 mg | SUBCUTANEOUS | Status: DC
  Administered 2018-08-24 – 2018-08-29 (×6): 40 mg via SUBCUTANEOUS
  Filled 2018-08-24 (×6): qty 0.4

## 2018-08-24 MED ORDER — ACETAMINOPHEN 325 MG PO TABS: 650.0000 mg | ORAL_TABLET | Freq: Four times a day (QID) | ORAL | Status: DC | PRN

## 2018-08-24 MED ORDER — SODIUM CHLORIDE 0.9 % IV SOLN
INTRAVENOUS | Status: DC
  Administered 2018-08-24 – 2018-08-30 (×11): via INTRAVENOUS

## 2018-08-24 MED ORDER — SODIUM CHLORIDE 0.9 % IV BOLUS
1000.0000 mL | Freq: Once | INTRAVENOUS | Status: AC
  Administered 2018-08-24: 01:00:00 1000 mL via INTRAVENOUS

## 2018-08-24 MED ORDER — METOPROLOL TARTRATE 5 MG/5ML IV SOLN
2.5000 mg | Freq: Once | INTRAVENOUS | Status: AC
  Administered 2018-08-24: 05:00:00 2.5 mg via INTRAVENOUS
  Filled 2018-08-24: qty 5

## 2018-08-24 MED ORDER — ACETAMINOPHEN 650 MG RE SUPP: 650.0000 mg | Freq: Four times a day (QID) | RECTAL | Status: DC | PRN

## 2018-08-24 MED ORDER — SODIUM CHLORIDE 0.9 % IV BOLUS
1000.0000 mL | Freq: Once | INTRAVENOUS | Status: AC
  Administered 2018-08-24: 1000 mL via INTRAVENOUS

## 2018-08-24 MED ORDER — MORPHINE SULFATE (PF) 4 MG/ML IV SOLN
4.0000 mg | Freq: Once | INTRAVENOUS | Status: AC
  Administered 2018-08-24: 01:00:00 4 mg via INTRAVENOUS
  Filled 2018-08-24: qty 1

## 2018-08-24 MED ORDER — ONDANSETRON HCL 4 MG/2ML IJ SOLN: 4.0000 mg | Freq: Four times a day (QID) | INTRAMUSCULAR | Status: DC | PRN

## 2018-08-24 MED ORDER — TRAMADOL HCL 50 MG PO TABS
50.0000 mg | ORAL_TABLET | Freq: Four times a day (QID) | ORAL | Status: DC
  Administered 2018-08-24 – 2018-08-30 (×20): 50 mg via ORAL
  Filled 2018-08-24 (×20): qty 1

## 2018-08-24 MED ORDER — ASPIRIN EC 325 MG PO TBEC
325.0000 mg | DELAYED_RELEASE_TABLET | Freq: Every day | ORAL | Status: DC
  Administered 2018-08-24 – 2018-08-30 (×7): 325 mg via ORAL
  Filled 2018-08-24 (×7): qty 1

## 2018-08-24 MED ORDER — MORPHINE SULFATE (PF) 2 MG/ML IV SOLN: 2.0000 mg | INTRAVENOUS | Status: DC | PRN

## 2018-08-24 NOTE — ED Notes (Signed)
ED TO INPATIENT HANDOFF REPORT  ED Nurse Name and Phone #: Orpha BurKaty [phone]  S Name/Age/Gender Marvin Roberts NeitherHyquan Zirkelbach- Inmate in Custody 28 y.o. male Room/Bed: ED15A/ED15A  Code Status   Code Status: Full Code  Home/SNF/Other Home/ Jail Patient oriented to: self, place, time and situation Is this baseline? Yes   Triage Complete: Triage complete  Chief Complaint wound check  Triage Note Pt arrived via [facility] under arrest with left knee surgery Sunday. Knee has been bleeding with no resolve, has been changed twice with jail staff, there is blood on wrap on arrival.    Allergies Allergies  Allergen Reactions  . Coconut Oil Rash    Level of Care/Admitting Diagnosis ED Disposition    ED Disposition Condition Comment   Admit  Hospital Area: [facility] [[id number]]  Level of Care: Med-Surg [16]  Covid Evaluation: Asymptomatic Screening Protocol (No Symptoms)  Diagnosis: Rhabdomyolysis [728.88.ICD-9-CM]  Admitting Physician: MANSY, JAN A [[id number]]  Attending Physician: MANSY, JAN A [[id number]]  Estimated length of stay: past midnight tomorrow  Certification:: I certify this patient will need inpatient services for at least 2 midnights  PT Class (Do Not Modify): Inpatient [101]  PT Acc Code (Do Not Modify): Private [1]       B Medical/Surgery History History reviewed. No pertinent past medical history. Past Surgical History:  Procedure Laterality Date  . KNEE SURGERY  08/21/2018  . ORIF TIBIA PLATEAU Left 08/21/2018   Procedure: OPEN REDUCTION INTERNAL FIXATION (ORIF) TIBIAL PLATEAU;  Surgeon: Menz, Michael, MD;  Location: [facility] ORS;  Service: Orthopedics;  Laterality: Left;     A IV Location/Drains/Wounds Patient Lines/Drains/Airways Status   Active Line/Drains/Airways    Name:   Placement date:   Placement time:   Site:   Days:   Peripheral IV 08/23/18 Left Antecubital   08/23/18    1957    Antecubital   1   Peripheral IV 08/23/18 Left Forearm   08/23/18     19 57    Forearm   1   Incision (Closed) 08/21/18 Leg Left   08/21/18    1116     3          Intake/Output Last 24 hours  Intake/Output Summary (Last 24 hours) at 08/24/2018 0327 Last data filed at 08/24/2018 0015 Gross per 24 hour  Intake -  Output 850 ml  Net -850 ml    Labs/Imaging Results for orders placed or performed during the hospital encounter of 08/23/18 (from the past 48 hour(s))  Lactic acid, plasma     Status: None   Collection Time: 08/23/18  7:35 PM  Result Value Ref Range   Lactic Acid, Venous 1.1 0.5 - 1.9 mmol/L    Comment: Performed at [facility], [address] [place], [place] [address]  Comprehensive metabolic panel     Status: Abnormal   Collection Time: 08/23/18  7:35 PM  Result Value Ref Range   Sodium 139 135 - 145 mmol/L   Potassium 4.5 3.5 - 5.1 mmol/L   Chloride 100 98 - 111 mmol/L   CO2 30 22 - 32 mmol/L   Glucose, Bld 89 70 - 99 mg/dL   BUN 9 6 - 20 mg/dL   Creatinine, Ser 8.290.95 0.61 - 1.24 mg/dL   Calcium 9.4 8.9 - 56.210.3 mg/dL   Total Protein 8.2 (H) 6.5 - 8.1 g/dL   Albumin 4.6 3.5 - 5.0 g/dL   AST 130288 (H) 15 - 41 U/L   ALT 62 (H) 0 -  44 U/L   Alkaline Phosphatase 48 38 - 126 U/L   Total Bilirubin 0.9 0.3 - 1.2 mg/dL   GFR calc non Af Amer >60 >60 mL/min   GFR calc Af Amer >60 >60 mL/min   Anion gap 9 5 - 15    Comment: Performed at [facility], [address] [place], [place] [address]  CBC WITH DIFFERENTIAL     Status: None   Collection Time: 08/23/18  7:35 PM  Result Value Ref Range   WBC 8.6 4.0 - 10.5 K/uL   RBC 4.72 4.22 - 5.81 MIL/uL   Hemoglobin 14.2 13.0 - 17.0 g/dL   HCT 19.143.2 47.839.0 - 29.552.0 %   MCV 91.5 80.0 - 100.0 fL   MCH 30.1 26.0 - 34.0 pg   MCHC 32.9 30.0 - 36.0 g/dL   RDW 62.113.9 30.811.5 - 65.715.5 %   Platelets 195 150 - 400 K/uL   nRBC 0.0 0.0 - 0.2 %   Neutrophils Relative % 75 %   Neutro Abs 6.4 1.7 - 7.7 K/uL   Lymphocytes Relative 14 %   Lymphs Abs 1.2 0.7 - 4.0 K/uL   Monocytes Relative 10 %    Monocytes Absolute 0.9 0.1 - 1.0 K/uL   Eosinophils Relative 1 %   Eosinophils Absolute 0.1 0.0 - 0.5 K/uL   Basophils Relative 0 %   Basophils Absolute 0.0 0.0 - 0.1 K/uL   Immature Granulocytes 0 %   Abs Immature Granulocytes 0.03 0.00 - 0.07 K/uL    Comment: Performed at [facility], [address] [place], [place] [address]  Blood Culture (routine x 2)     Status: None (Preliminary result)   Collection Time: 08/23/18  7:35 PM   Specimen: BLOOD  Result Value Ref Range   Specimen Description BLOOD LEFT ANTECUBITAL    Special Requests      BOTTLES DRAWN AEROBIC AND ANAEROBIC Blood Culture results may not be optimal due to an excessive volume of blood received in culture bottles   Culture  Setup Time      NO ORGANISMS SEEN Performed at [facility], [address] [place], [place] [address]    Culture PENDING    Report Status PENDING   CK     Status: Abnormal   Collection Time: 08/23/18  7:35 PM  Result Value Ref Range   Total CK 15,406 (H) 49 - 397 U/L    Comment: confirmed by manual dilution  CRITICAL RESULT CALLED TO, READ BACK BY AND VERIFIED WITH: kate Jung Yurchak @038  08/24/2018 ttg Performed at [facility], [address] [place], [place] [address]   Type and screen [facility]     Status: None   Collection Time: 08/23/18  7:45 PM  Result Value Ref Range   ABO/RH(D) A NEG    Antibody Screen NEG    Sample Expiration      08/26/2018,2359 Performed at [facility], [address] [place], [place] [address]   SARS Coronavirus 2 (CEPHEID- Performed in [facility] lab), Hosp Order     Status: None   Collection Time: 08/23/18  7:56 PM   Specimen: Nasopharyngeal Swab  Result Value Ref Range   SARS Coronavirus 2 NEGATIVE NEGATIVE    Comment: (NOTE) If result is NEGATIVE SARS-CoV-2 target nucleic acids are NOT DETECTED. The SARS-CoV-2 RNA is generally detectable in upper and lower  respiratory  specimens during the acute phase of infection. The lowest  concentration of SARS-CoV-2  viral copies this assay can detect is 250  copies / mL. A negative result does not preclude SARS-CoV-2 infection  and should not be used as the sole basis for treatment or other  patient management decisions.  A negative result may occur with  improper specimen collection / handling, submission of specimen other  than nasopharyngeal swab, presence of viral mutation(s) within the  areas targeted by this assay, and inadequate number of viral copies  (<250 copies / mL). A negative result must be combined with clinical  observations, patient history, and epidemiological information. If result is POSITIVE SARS-CoV-2 target nucleic acids are DETECTED. The SARS-CoV-2 RNA is generally detectable in upper and lower  respiratory specimens dur ing the acute phase of infection.  Positive  results are indicative of active infection with SARS-CoV-2.  Clinical  correlation with patient history and other diagnostic information is  necessary to determine patient infection status.  Positive results do  not rule out bacterial infection or co-infection with other viruses. If result is PRESUMPTIVE POSTIVE SARS-CoV-2 nucleic acids MAY BE PRESENT.   A presumptive positive result was obtained on the submitted specimen  and confirmed on repeat testing.  While 2019 novel coronavirus  (SARS-CoV-2) nucleic acids may be present in the submitted sample  additional confirmatory testing may be necessary for epidemiological  and / or clinical management purposes  to differentiate between  SARS-CoV-2 and other Sarbecovirus currently known to infect humans.  If clinically indicated additional testing with an alternate test  methodology [phone]) is advised. The SARS-CoV-2 RNA is generally  detectable in upper and lower respiratory sp ecimens during the acute  phase of infection. The expected result is Negative. Fact Sheet for  Patients:  StrictlyIdeas.no Fact Sheet for Healthcare Providers: BankingDealers.co.za This test is not yet approved or cleared by the [place] FDA and has been authorized for detection and/or diagnosis of SARS-CoV-2 by FDA under an Emergency Use Authorization (EUA).  This EUA will remain in effect (meaning this test can be used) for the duration of the COVID-19 declaration under Section 564(b)(1) of the Act, 21 U.S.C. section 360bbb-3(b)(1), unless the authorization is terminated or revoked sooner. Performed at [facility], [address] [place], [place] [address]    Dg Knee Complete 4 Views Left  Result Date: 08/23/2018 CLINICAL DATA:  Knee pain and swelling EXAM: LEFT KNEE - COMPLETE 4+ VIEW COMPARISON:  08/21/2018 FINDINGS: Lateral buttress plate and multiple screws at the proximal RIGHT tibia post ORIF of a lateral plateau fracture. Hardware appears intact without surrounding lucency. Overlying skin clips. Scattered soft tissue swelling laterally at the distal thigh through proximal lower leg. Joint spaces preserved. Osseous mineralization normal. Small knee joint effusion. No additional fracture, dislocation or bone destruction. IMPRESSION: Post ORIF of the LEFT lateral tibial plateau. Joint effusion and scattered soft tissue swelling. No acute osseous abnormalities. Electronically Signed   By: Lavonia Dana M.D.   On: 08/23/2018 19:54    Pending Labs Unresulted Labs (From admission, onward)    Start     Ordered   08/24/18 7106  Basic metabolic panel  Tomorrow morning,   STAT     08/24/18 0146   08/24/18 0500  CBC  Tomorrow morning,   STAT     08/24/18 0146   08/24/18 0500  CK  Daily,   STAT     08/24/18 0148   08/24/18 2694  Basic metabolic panel  Once,   STAT     08/24/18 0322   08/23/18  1925  Lactic acid, plasma  STAT Now then every 3 hours,   STAT     08/23/18 1924   08/23/18 1925  Blood Culture (routine x 2)   BLOOD CULTURE X 2,   STAT     08/23/18 1924   08/23/18 1925  Urine culture  ONCE - STAT,   STAT     08/23/18 1924          Vitals/Pain Today's Vitals   08/24/18 0030 08/24/18 0130 08/24/18 0200 08/24/18 0230  BP: (!) 151/94 (!) 154/89 (!) 150/92 (!) 151/88  Pulse: (!) 127 (!) 119 (!) 116 (!) 112  Resp: 16 17 16 16   Temp:      TempSrc:      SpO2: 99% 100% 98% 99%  Weight:      Height:      PainSc:        Isolation Precautions No active isolations  Medications Medications  aspirin EC tablet 325 mg (has no administration in time range)  HYDROcodone-acetaminophen (NORCO/VICODIN) 5-325 MG per tablet 1-2 tablet (has no administration in time range)  traMADol (ULTRAM) tablet 50 mg (has no administration in time range)  enoxaparin (LOVENOX) injection 40 mg (has no administration in time range)  0.9 %  sodium chloride infusion (has no administration in time range)  acetaminophen (TYLENOL) tablet 650 mg (has no administration in time range)    Or  acetaminophen (TYLENOL) suppository 650 mg (has no administration in time range)  traZODone (DESYREL) tablet 25 mg (has no administration in time range)  magnesium hydroxide (MILK OF MAGNESIA) suspension 30 mL (has no administration in time range)  ondansetron (ZOFRAN) tablet 4 mg (has no administration in time range)    Or  ondansetron (ZOFRAN) injection 4 mg (has no administration in time range)  morphine 2 MG/ML injection 2 mg (has no administration in time range)  ondansetron (ZOFRAN) injection 4 mg (4 mg Intravenous Given 08/23/18 1957)  acetaminophen (TYLENOL) tablet 1,000 mg (1,000 mg Oral Given 08/23/18 2042)  sodium chloride 0.9 % bolus 1,000 mL (0 mLs Intravenous Stopped 08/23/18 2203)  oxyCODONE-acetaminophen (PERCOCET/ROXICET) 5-325 MG per tablet 1 tablet (1 tablet Oral Given 08/23/18 2214)  sodium chloride 0.9 % bolus 1,000 mL (0 mLs Intravenous Stopped 08/23/18 2320)  sodium chloride 0.9 % bolus 500 mL (0 mLs Intravenous  Stopped 08/24/18 0030)  ketorolac (TORADOL) 30 MG/ML injection 30 mg (30 mg Intravenous Given 08/24/18 0055)  morphine 4 MG/ML injection 4 mg (4 mg Intravenous Given 08/24/18 0055)  sodium chloride 0.9 % bolus 1,000 mL (0 mLs Intravenous Stopped 08/24/18 0207)  sodium chloride 0.9 % bolus 1,000 mL (0 mLs Intravenous Stopped 08/24/18 0313)    Mobility walks with person assist Low fall risk   Focused Assessments Renal Assessment Handoff: concerns for rhabdomyolysis after ORIF      R Recommendations: See Admitting Provider Note  Report given to:   Additional Notes: Inmate in custody

## 2018-08-24 NOTE — ED Provider Notes (Signed)
I assumed care of the patient at 11:00 PM from Dr. Cherylann Banas.  Patient remained persistently tachycardic with heart rates in the high 120s despite receiving 2-1/2 L of normal saline.  In addition patient received an additional 4 mg of morphine.  Patient's pain unchanged remaining at 8 out of 10.  Patient is creatinine kinase resulted at 00 48 which was 15,406.  Given this finding of rhabdomyolysis patient received an additional 2 L of IV normal saline.  Patient discussed with Dr. Sidney Ace and Dr. Marry Guan.  Patient will be admitted for further evaluation and management.   Gregor Hams, MD 08/24/18 [phone]

## 2018-08-24 NOTE — H&P (Addendum)
[employer] [facility] [place] [facility]   PATIENT NAME: Marvin NeitherHyquan Churchwell    MR#:  [id number]  DATE OF BIRTH:  10/04/1990  DATE OF ADMISSION:  08/23/2018  PRIMARY CARE PHYSICIAN: Patient, No Pcp Per   REQUESTING/REFERRING PHYSICIAN: Bayard MalesBrown, Pl[place], MD  CHIEF COMPLAINT:   Chief Complaint  Patient presents with  . Knee Pain    HISTORY OF PRESENT ILLNESS:  Marvin NeitherHyquan Vita  is a 28 y.o. African-American male with a known history of ongoing drug abuse, who presented to the emergency room with acute onset of worsening left knee pain with bleeding.  He had left tibial ORIF a few days ago and was discharged yesterday.  He is currently incarcerated.  He graded his pain is 10/10 in severity and he has been getting Tylenol 3.  No purulent drainage.  No tingling or numbness of the left leg.  He denies any generalized pain.  He had an accidental fall before his left tibial plateau fracture for which she was admitted few days ago.  No fever or chills.  No nausea vomiting or abdominal pain or diarrhea.  Upon presentation to the emergency room, his blood pressure was 149/91 with a pulse of 118 and otherwise normal vital signs.  Labs revealed remarkably elevated CK of 15,406.  His COVID-19 came back negative.  Dr. Ernest PineHooten was consulted given the patient's drainage from his left knee wound and he saw no evidence for infection or compartment syndrome.  He applied Betadine to the surgical incision site and a bulky dressing to the knee and lower leg.  He recommended elevation of the left lower extremity.  The patient was given 2 and half liters of IV normal saline and was still tachycardic.  He will be admitted to medical monitored bed for further evaluation and management. PAST MEDICAL HISTORY:  Ongoing tobacco abuse  PAST SURGICAL HISTORY:   Past Surgical History:  Procedure Laterality Date  . KNEE SURGERY  08/21/2018  . ORIF TIBIA PLATEAU Left 08/21/2018   Procedure: OPEN REDUCTION INTERNAL  FIXATION (ORIF) TIBIAL PLATEAU;  Surgeon: Kennedy BuckerMenz, Michael, MD;  Location: [facility] ORS;  Service: Orthopedics;  Laterality: Left;    SOCIAL HISTORY:   Social History   Tobacco Use  . Smoking status: Current Every Day Smoker  . Smokeless tobacco: Never Used  Substance Use Topics  . Alcohol use: Not on file    FAMILY HISTORY:  History reviewed. No pertinent family history.  He denies any familial diseases  DRUG ALLERGIES:   Allergies  Allergen Reactions  . Coconut Oil Rash    REVIEW OF SYSTEMS:   ROS As per history of present illness. All pertinent systems were reviewed above. Constitutional,  HEENT, cardiovascular, respiratory, GI, GU, musculoskeletal, neuro, psychiatric, endocrine,  integumentary and hematologic systems were reviewed and are otherwise  negative/unremarkable except for positive findings mentioned above in the HPI.   MEDICATIONS AT HOME:   Prior to Admission medications   Medication Sig Start Date End Date Taking? Authorizing Provider  aspirin EC 325 MG tablet Take 1 tablet (325 mg total) by mouth daily. 08/22/18  Yes Anson OregonMcGhee, James Lance, PA-C  HYDROcodone-acetaminophen (NORCO/VICODIN) 5-325 MG tablet Take 1-2 tablets by mouth every 6 (six) hours as needed for moderate pain. 08/22/18  Yes Anson OregonMcGhee, James Lance, PA-C  traMADol (ULTRAM) 50 MG tablet Take 1 tablet (50 mg total) by mouth every 6 (six) hours. 08/22/18  Yes Anson OregonMcGhee, James Lance, PA-C      VITAL SIGNS:  Blood pressure Marland Kitchen(!)  151/88, pulse (!) 112, temperature 99.9 F (37.7 C), temperature source Oral, resp. rate 16, height 5\' 6"  (1.676 m), weight 68.5 kg, SpO2 99 %.  PHYSICAL EXAMINATION:  Physical Exam  GENERAL:  28 y.o.-year-old African-American male patient lying in the bed with no acute distress.  EYES: Pupils equal, round, reactive to light and accommodation. No scleral icterus. Extraocular muscles intact.  HEENT: Head atraumatic, normocephalic. Oropharynx and nasopharynx clear.  NECK:  Supple, no  jugular venous distention. No thyroid enlargement, no tenderness.  LUNGS: Normal breath sounds bilaterally, no wheezing, rales,rhonchi or crepitation. No use of accessory muscles of respiration.  CARDIOVASCULAR: Regular rate and rhythm, S1, S2 normal. No murmurs, rubs, or gallops.  ABDOMEN: Soft, nondistended, nontender. Bowel sounds present. No organomegaly or mass.  EXTREMITIES: No pedal edema, cyanosis, or clubbing. Musculoskeletal: Dressed left leg and knee with residual serosanguineous discharge. NEUROLOGIC: Cranial nerves II through XII are intact. Muscle strength 5/5 in all extremities. Sensation intact. Gait not checked.  PSYCHIATRIC: The patient is alert and oriented x 3.  Normal affect and good eye contact. SKIN: No obvious rash, lesion, or ulcer.   LABORATORY PANEL:   CBC Recent Labs  Lab 08/23/18 1935  WBC 8.6  HGB 14.2  HCT 43.2  PLT 195   ------------------------------------------------------------------------------------------------------------------  Chemistries  Recent Labs  Lab 08/23/18 1935  NA 139  K 4.5  CL 100  CO2 30  GLUCOSE 89  BUN 9  CREATININE 0.95  CALCIUM 9.4  AST 288*  ALT 62*  ALKPHOS 48  BILITOT 0.9   ------------------------------------------------------------------------------------------------------------------  Cardiac Enzymes No results for input(s): TROPONINI in the last 168 hours. ------------------------------------------------------------------------------------------------------------------  RADIOLOGY:  Dg Knee Complete 4 Views Left  Result Date: 08/23/2018 CLINICAL DATA:  Knee pain and swelling EXAM: LEFT KNEE - COMPLETE 4+ VIEW COMPARISON:  08/21/2018 FINDINGS: Lateral buttress plate and multiple screws at the proximal RIGHT tibia post ORIF of a lateral plateau fracture. Hardware appears intact without surrounding lucency. Overlying skin clips. Scattered soft tissue swelling laterally at the distal thigh through proximal  lower leg. Joint spaces preserved. Osseous mineralization normal. Small knee joint effusion. No additional fracture, dislocation or bone destruction. IMPRESSION: Post ORIF of the LEFT lateral tibial plateau. Joint effusion and scattered soft tissue swelling. No acute osseous abnormalities. Electronically Signed   By: Lavonia Dana M.D.   On: 08/23/2018 19:54      IMPRESSION AND PLAN:   1.  Acute rhabdomyolysis.  The patient will be admitted to a medical monitored bed.  We will place him on hydration with half-normal saline and follow daily CK.  At this time he has no myalgia.  Will follow urinalysis pH and acidify urine as needed.  This could have been related to lack of movement after his left tibial plateau fracture.  2.  Sinus tachycardia.  This looks secondary to #1 with associated volume depletion.  We will continue hydration with IV normal saline and give 2.5 mg of IV Lopressor.  We will also check TSH level  3.  Recent left tibial plateau fracture status post ORIF.  Orthopedic consultation follow-up will be obtained.  4.  Ongoing tobacco abuse.  He was counseled for smoking cessation and will receive further counseling here.  5.  DVT prophylaxis.  Subcutaneous Lovenox.    All the records are reviewed and case discussed with ED provider. The plan of care was discussed in details with the patient (and family). I answered all questions. The patient agreed to proceed with the above  mentioned plan. Further management will depend upon hospital course.   CODE STATUS: Full code  TOTAL TIME TAKING CARE OF THIS PATIENT: 45 minutes.    Hannah BeatJan A Puja Caffey M.D on 08/24/2018 at 3:22 AM  Pager - [phone]  After 6pm go to [email or web address] - Social research officer, [email or web address] [email or web address]  [facility]  Office  [phone]  CC: Primary care physician; Patient, No Pcp Per   Note: This dictation was prepared with Dragon dictation along with smaller phrase technology. Any transcriptional errors that  result from this process are unintentional.

## 2018-08-24 NOTE — Progress Notes (Signed)
[facility] [place] NAME: Finn Amos    MR#:  [id number]  DATE OF BIRTH:  07-08-1990  SUBJECTIVE:  CHIEF COMPLAINT:   Chief Complaint  Patient presents with  . Knee Pain    REVIEW OF SYSTEMS:  Review of Systems  Constitutional: Negative for chills and fever.  HENT: Negative for hearing loss and tinnitus.   Eyes: Negative for blurred vision and double vision.  Respiratory: Negative for cough and shortness of breath.   Cardiovascular: Negative for chest pain.  Gastrointestinal: Negative for heartburn and nausea.  Genitourinary: Negative for dysuria and urgency.  Musculoskeletal: Negative for back pain and myalgias.       Dressing in place over left knee joint  Skin: Negative for itching and rash.  Psychiatric/Behavioral: Negative for depression and suicidal ideas.    DRUG ALLERGIES:   Allergies  Allergen Reactions  . Coconut Oil Rash   VITALS:  Blood pressure (!) 142/92, pulse 98, temperature 99.1 F (37.3 C), temperature source Oral, resp. rate 18, height 5\' 6"  (1.676 m), weight 68.5 kg, SpO2 100 %. PHYSICAL EXAMINATION:  Physical Exam   GENERAL:  28 y.o.-year-old African-American male patient lying in the bed with no acute distress.  EYES: Pupils equal, round, reactive to light and accommodation. No scleral icterus. Extraocular muscles intact.  HEENT: Head atraumatic, normocephalic. Oropharynx and nasopharynx clear.  NECK:  Supple, no jugular venous distention. No thyroid enlargement, no tenderness.  LUNGS: Normal breath sounds bilaterally, no wheezing, rales,rhonchi or crepitation. No use of accessory muscles of respiration.  CARDIOVASCULAR: Regular rate and rhythm, S1, S2 normal. No murmurs, rubs, or gallops.  ABDOMEN: Soft, nondistended, nontender. Bowel sounds present. No organomegaly or mass.  EXTREMITIES: No pedal edema, cyanosis, or clubbing. Musculoskeletal: Dressed left leg and knee with residual serosanguineous  discharge. NEUROLOGIC: Generalized weakness with no focal deficit.  Gait not checked.  PSYCHIATRIC: The patient is alert and oriented x 3.  Normal affect and good eye contact. SKIN: No obvious rash, lesion, or ulcer.  LABORATORY PANEL:  Male CBC Recent Labs  Lab 08/24/18 0414  WBC 9.6  HGB 11.6*  HCT 36.3*  PLT 177   ------------------------------------------------------------------------------------------------------------------ Chemistries  Recent Labs  Lab 08/23/18 1935 08/24/18 0414  NA 139 140  K 4.5 4.8  CL 100 109  CO2 30 27  GLUCOSE 89 103*  BUN 9 8  CREATININE 0.95 0.95  CALCIUM 9.4 8.1*  AST 288*  --   ALT 62*  --   ALKPHOS 48  --   BILITOT 0.9  --    RADIOLOGY:  Dg Knee Complete 4 Views Left  Result Date: 08/23/2018 CLINICAL DATA:  Knee pain and swelling EXAM: LEFT KNEE - COMPLETE 4+ VIEW COMPARISON:  08/21/2018 FINDINGS: Lateral buttress plate and multiple screws at the proximal RIGHT tibia post ORIF of a lateral plateau fracture. Hardware appears intact without surrounding lucency. Overlying skin clips. Scattered soft tissue swelling laterally at the distal thigh through proximal lower leg. Joint spaces preserved. Osseous mineralization normal. Small knee joint effusion. No additional fracture, dislocation or bone destruction. IMPRESSION: Post ORIF of the LEFT lateral tibial plateau. Joint effusion and scattered soft tissue swelling. No acute osseous abnormalities. Electronically Signed   By: Lavonia Dana M.D.   On: 08/23/2018 19:54   ASSESSMENT AND PLAN:   1.  Acute rhabdomyolysis.  Being hydrated with IV fluids with gradual improvement in total CK level.  Down to about 11,000.  Follow-up on CK  level in a.m.  This could have been related to lack of movement after his left tibial plateau fracture.  2.  Sinus tachycardia.  This looks secondary to #1 with associated volume depletion.   Continue hydration with normal saline at 150 cc an hour.  Was given a dose  of 2.5 mg of IV Lopressor yesterday.  Will consider adding scheduled Lopressor if no resolution in a.m. TSH level normal  3.  Recent left tibial plateau fracture status post ORIF.  Orthopedic service following patient. Removed all compressive dressings.  And to apply negative pressure incisional dressing today. Continue to monitor pain, swelling.    4.  Ongoing tobacco abuse.  He was counseled for smoking cessation and will receive further counseling here.  5.  DVT prophylaxis.  Subcutaneous Lovenox.   All the records are reviewed and case discussed with Care Management/Social Worker. Management plans discussed with the patient, family and they are in agreement.  CODE STATUS: Full Code  TOTAL TIME TAKING CARE OF THIS PATIENT:   35minutes.   More than 50% of the time was spent in counseling/coordination of care: YES  POSSIBLE D/C IN 2 DAYS, DEPENDING ON CLINICAL CONDITION.   Keriana Sarsfield M.D on 08/24/2018 at 1:26 PM  Between 7am to 6pm - Pager - [phone]  After 6pm go to [email or web address] - Social research officer, governmentpassword [email or web address]  [facility]  Office  [phone]  CC: Primary care physician; Patient, No Pcp Per  Note: This dictation was prepared with Dragon dictation along with smaller phrase technology. Any transcriptional errors that result from this process are unintentional.

## 2018-08-24 NOTE — Progress Notes (Signed)
Patient has noted frank bleeding saturating dressings that have already been re-inforced.  PA aware .  Orders rec'd to remove all dressings and ace wrap and apply 2 ABD's .  Will apply negative pressure wound vac per PA.

## 2018-08-24 NOTE — Evaluation (Signed)
Physical Therapy Evaluation Patient Details Name: Marvin Roberts MRN: [id number] DOB: 07/25/1990 Today's Date: 08/24/2018   History of Present Illness  Pt admitted for rhabdomyolysis with complaints of bleeding from surgical incision. Pt is s/p L ORIF of tibial plateau fx s/p fall while incarcerated. Pt has severe knee pain at this time rating 20/10.  Clinical Impression  Pt is a pleasant 28 year old male who was admitted for rhabdomyolysis. Pt is recently s/p L ORIF of tibial plateau fx.  Pt performs bed mobility with cga however unable to sit at EOB long due to intense pain. Unable to progress to standing at this time. Pt demonstrates deficits with pain/mobility. Will keep QD at this time given recent surgery. Would benefit from skilled PT to address above deficits and promote optimal return to PLOF. Recommend OP PT at this time.    Follow Up Recommendations Outpatient PT    Equipment Recommendations  None recommended by PT    Recommendations for Other Services       Precautions / Restrictions Precautions Precautions: Fall Restrictions Weight Bearing Restrictions: Yes LLE Weight Bearing: Non weight bearing      Mobility  Bed Mobility Overal bed mobility: Needs Assistance Bed Mobility: Supine to Sit     Supine to sit: Min guard     General bed mobility comments: cued for hooking under surgical leg. Once seated, unable to scoot towards EOB as pt has increased severe pain with dependent position. Limited knee flexion while seated. Only able to tolerate sitting for 1 min, needed to return supine  Transfers                 General transfer comment: unable this date  Ambulation/Gait                Stairs            Wheelchair Mobility    Modified Rankin (Stroke Patients Only)       Balance Overall balance assessment: History of Falls                                           Pertinent Vitals/Pain Pain Assessment: 0-10 Pain  Score: 10-Worst pain ever Pain Location: L knee Pain Descriptors / Indicators: Operative site guarding Pain Intervention(s): Limited activity within patient's tolerance;Patient requesting pain meds-RN notified    Home Living Family/patient expects to be discharged to:: [facility]                 Additional Comments: per police, pt may be able to stay on first level in medical side. No stairs for entry    Prior Function Level of Independence: Independent         Comments: was previously indep, after surgery- ambulating with B crutches or WC. Due to pain has not ambulated in a day     Hand Dominance        Extremity/Trunk Assessment   Upper Extremity Assessment Upper Extremity Assessment: Overall WFL for tasks assessed    Lower Extremity Assessment Lower Extremity Assessment: Generalized weakness(L LE grossly 2/5 pain limited R LE 5/5)       Communication   Communication: No difficulties  Cognition Arousal/Alertness: Awake/alert Behavior During Therapy: WFL for tasks assessed/performed Overall Cognitive Status: Within Functional Limits for tasks assessed  General Comments      Exercises Other Exercises Other Exercises: deferred due to pain   Assessment/Plan    PT Assessment Patient needs continued PT services  PT Problem List Decreased strength;Decreased activity tolerance;Decreased balance;Decreased mobility;Pain       PT Treatment Interventions DME instruction;Gait training;Therapeutic exercise;Balance training    PT Goals (Current goals can be found in the Care Plan section)  Acute Rehab PT Goals Patient Stated Goal: to leave the hospital PT Goal Formulation: With patient Time For Goal Achievement: 09/07/18 Potential to Achieve Goals: Good    Frequency 7X/week   Barriers to discharge        Co-evaluation               AM-PAC PT "6 Clicks" Mobility  Outcome Measure Help  needed turning from your back to your side while in a flat bed without using bedrails?: None Help needed moving from lying on your back to sitting on the side of a flat bed without using bedrails?: A Little Help needed moving to and from a bed to a chair (including a wheelchair)?: A Lot Help needed standing up from a chair using your arms (e.g., wheelchair or bedside chair)?: A Lot Help needed to walk in hospital room?: A Lot Help needed climbing 3-5 steps with a railing? : A Lot 6 Click Score: 15    End of Session   Activity Tolerance: Patient limited by pain Patient left: in bed(left with police in bed) Nurse Communication: Mobility status PT Visit Diagnosis: Muscle weakness (generalized) (M62.81);History of falling (Z91.81);Difficulty in walking, not elsewhere classified (R26.2);Pain Pain - Right/Left: Left Pain - part of body: Leg    Time: 0388-8280 PT Time Calculation (min) (ACUTE ONLY): 16 min   Charges:   PT Evaluation $PT Eval Low Complexity: 1 Low          Greggory Stallion, PT, DPT [phone]   Pedro Oldenburg 08/24/2018, 4:31 PM

## 2018-08-24 NOTE — Progress Notes (Addendum)
   Subjective:    Patient reports pain as 8 on 0-10 scale.  Pain improving from yesterday Patient is well, and has had no acute complaints or problems Denies any CP, SOB, ABD pain.   Objective: Vital signs in last 24 hours: Temp:  [98.4 F (36.9 C)-99.9 F (37.7 C)] 99.1 F (37.3 C) (07/22 0746) Pulse Rate:  [92-136] 98 (07/22 0746) Resp:  [14-21] 18 (07/22 0746) BP: (133-168)/(76-101) 142/92 (07/22 0746) SpO2:  [97 %-100 %] 100 % (07/22 0746) Weight:  [68.5 kg] 68.5 kg (07/21 1851)  Intake/Output from previous day: 07/21 0701 - 07/22 0700 In: 292.2 [I.V.:292.2] Out: 850 [Urine:850] Intake/Output this shift: Total I/O In: 390 [P.O.:390] Out: -   Recent Labs    08/23/18 1935 08/24/18 0414  HGB 14.2 11.6*   Recent Labs    08/23/18 1935 08/24/18 0414  WBC 8.6 9.6  RBC 4.72 3.88*  HCT 43.2 36.3*  PLT 195 177   Recent Labs    08/23/18 1935 08/24/18 0414  NA 139 140  K 4.5 4.8  CL 100 109  CO2 30 27  BUN 9 8  CREATININE 0.95 0.95  GLUCOSE 89 103*  CALCIUM 9.4 8.1*   No results for input(s): LABPT, INR in the last 72 hours.  EXAM General - Patient is Alert, Appropriate and Oriented Extremity - Neurovascular intact Intact pulses distally No cellulitis present Compartment soft but tender Sensation decreased dorsum of foot Unable to dorsiflex foot, consistent with preop consultation note. Calf sizes symmetrical  Dressing - moderate drainage   History reviewed. No pertinent past medical history.  Assessment/Plan:      Active Problems:   Rhabdomyolysis  Estimated body mass index is 24.37 kg/m as calculated from the following:   Height as of this encounter: 5\' 6"  (1.676 m).   Weight as of this encounter: 68.5 kg.  Removed all compressive dressings.  Will apply negative pressure incisional dressing today. Continue to monitor pain, swelling.   Ronney Asters, PA-C [place] 08/24/2018, 11:11 AM Wound vac applied

## 2018-08-24 NOTE — Progress Notes (Signed)
Patient resting in bed without c/o at this time. Wound Vac in place to left knee.

## 2018-08-25 LAB — MAGNESIUM: Magnesium: 2.1 mg/dL (ref 1.7–2.4)

## 2018-08-25 LAB — BASIC METABOLIC PANEL
Anion gap: 7 (ref 5–15)
BUN: 8 mg/dL (ref 6–20)
CO2: 27 mmol/L (ref 22–32)
Calcium: 8.4 mg/dL — ABNORMAL LOW (ref 8.9–10.3)
Chloride: 106 mmol/L (ref 98–111)
Creatinine, Ser: 0.83 mg/dL (ref 0.61–1.24)
GFR calc Af Amer: >60 mL/min (ref >60)
GFR calc non Af Amer: >60 mL/min (ref >60)
Glucose, Bld: 102 mg/dL — ABNORMAL HIGH (ref 70–99)
Potassium: 4 mmol/L (ref 3.5–5.1)
Sodium: 140 mmol/L (ref 135–145)

## 2018-08-25 LAB — URINE CULTURE: Culture: NO GROWTH

## 2018-08-25 LAB — CK: Total CK: 9801 U/L — ABNORMAL HIGH (ref 49–397)

## 2018-08-25 NOTE — Progress Notes (Signed)
Patient has had uneventful day. Officers at bedside. Wound vac in place to left knee, no drainage. Applejuice/prune juice offered. To promote BM. Also MOM given.

## 2018-08-25 NOTE — Progress Notes (Signed)
Physical Therapy Treatment Patient Details Name: Marvin NeitherHyquan Lampert MRN: [id number] DOB: 05/25/1990 Today's Date: 08/25/2018    History of Present Illness Pt admitted for rhabdomyolysis with complaints of bleeding from surgical incision. Pt is s/p L ORIF of tibial plateau fx s/p fall while incarcerated. Pt conitnues to have severe pain limitations.    PT Comments    Pt in bed upon entry, agreeable to participate. Pain rated as 7/10 while resting in bed, LLE elevated, but pain increases significantly with attempted mobility, particularly with transfers. Pt unable to rise to NWB 2/2 pain, attempts to rest LLE on RLE instance which doesn't work very well, ultimately more stabilization required for the LLE. Pt agreeable to trial foot resting on pillow (since foot on floor is also not tolerated) then he is able to perform 5xSTS with RW slowly and cautiously 2/2 high pain. HR at this time at 131 bpm. Pt unable to progress to AMB at this time, but reports he had minimal success with AC, only tolerating AMB 1x since surgery. Pain remains a big limiter to mobility progression. Unclear is some type of brace would improve mobility, but likely would not be congruent with current incarceration status.    Follow Up Recommendations  Outpatient PT     Equipment Recommendations  Roberts recommended by PT    Recommendations for Other Services       Precautions / Restrictions Precautions Precautions: Fall Restrictions Weight Bearing Restrictions: Yes LLE Weight Bearing: Non weight bearing    Mobility  Bed Mobility Overal bed mobility: Needs Assistance Bed Mobility: Supine to Sit;Sit to Supine     Supine to sit: Min guard Sit to supine: Min guard   General bed mobility comments: performs self assis tiwth leg hook; some assistance given while pt dons polarcare  Transfers Overall transfer level: Needs assistance Equipment used: Rolling walker (2 wheeled) Transfers: Sit to/from Stand            General transfer comment: performed 6 times from EOB with RW, requires support of Left foot on pillow for pain management  Ambulation/Gait Ambulation/Gait assistance: (unable to tolerate LLE suspension d/t pain)               Stairs             Wheelchair Mobility    Modified Rankin (Stroke Patients Only)       Balance Overall balance assessment: Modified Independent;No apparent balance deficits (not formally assessed)                                          Cognition Arousal/Alertness: Awake/alert Behavior During Therapy: WFL for tasks assessed/performed Overall Cognitive Status: Within Functional Limits for tasks assessed                                        Exercises      General Comments        Pertinent Vitals/Pain Pain Assessment: 0-10 Pain Score: 7  Pain Location: L knee resting; much worse with movement and transfers Pain Descriptors / Indicators: Operative site guarding Pain Intervention(s): Limited activity within patient's tolerance;Monitored during session;Premedicated before session;Repositioned;Patient requesting pain meds-RN notified;Ice applied    Home Living  Prior Function            PT Goals (current goals can now be found in the care plan section) Acute Rehab PT Goals Patient Stated Goal: to leave the hospital PT Goal Formulation: With patient Time For Goal Achievement: 09/07/18 Potential to Achieve Goals: Good Additional Goals Additional Goal #1: Pt will be able to perform bed mobility/transfers with mod I and safe technique in order to improve functional mobility. Progress towards PT goals: Progressing toward goals    Frequency    7X/week      PT Plan Current plan remains appropriate    Co-evaluation              AM-PAC PT "6 Clicks" Mobility   Outcome Measure  Help needed turning from your back to your side while in a flat bed without  using bedrails?: Roberts Help needed moving from lying on your back to sitting on the side of a flat bed without using bedrails?: Roberts Help needed moving to and from a bed to a chair (including a wheelchair)?: A Little Help needed standing up from a chair using your arms (e.g., wheelchair or bedside chair)?: A Little Help needed to walk in hospital room?: A Lot Help needed climbing 3-5 steps with a railing? : A Lot 6 Click Score: 18    End of Session Equipment Utilized During Treatment: Gait belt Activity Tolerance: Patient limited by pain Patient left: in bed;Other (comment)(law enforement officer in room) Nurse Communication: Mobility status PT Visit Diagnosis: Muscle weakness (generalized) (M62.81);History of falling (Z91.81);Difficulty in walking, not elsewhere classified (R26.2);Pain Pain - Right/Left: Left Pain - part of body: Leg     Time: 5462-7035 PT Time Calculation (min) (ACUTE ONLY): 36 min  Charges:  $Therapeutic Exercise: 23-37 mins               10:39 AM, 08/25/18 Etta Grandchild, PT, DPT Physical Therapist - [facility]  [phone] ([place])    Dessie Tatem C 08/25/2018, 10:36 AM

## 2018-08-25 NOTE — Progress Notes (Signed)
[facility] [place] [facility]   PATIENT NAME: Myrtie NeitherHyquan Leinen    MR#:  [id number]  DATE OF BIRTH:  03/26/1990  SUBJECTIVE:  CHIEF COMPLAINT:   Chief Complaint  Patient presents with  . Knee Pain   No new complaint this morning.  Resting comfortably. REVIEW OF SYSTEMS:  Review of Systems  Constitutional: Negative for chills and fever.  HENT: Negative for hearing loss and tinnitus.   Eyes: Negative for blurred vision and double vision.  Respiratory: Negative for cough and shortness of breath.   Cardiovascular: Negative for chest pain.  Gastrointestinal: Negative for heartburn and nausea.  Genitourinary: Negative for dysuria and urgency.  Musculoskeletal: Negative for back pain and myalgias.       Dressing in place over left knee joint  Skin: Negative for itching and rash.  Psychiatric/Behavioral: Negative for depression and suicidal ideas.    DRUG ALLERGIES:   Allergies  Allergen Reactions  . Coconut Oil Rash   VITALS:  Blood pressure (!) 166/93, pulse 100, temperature 99.7 F (37.6 C), temperature source Oral, resp. rate 17, height 5\' 6"  (1.676 m), weight 68.5 kg, SpO2 100 %. PHYSICAL EXAMINATION:  Physical Exam   GENERAL:  28 y.o.-year-old African-American male patient lying in the bed with no acute distress.  EYES: Pupils equal, round, reactive to light and accommodation. No scleral icterus. Extraocular muscles intact.  HEENT: Head atraumatic, normocephalic. Oropharynx and nasopharynx clear.  NECK:  Supple, no jugular venous distention. No thyroid enlargement, no tenderness.  LUNGS: Normal breath sounds bilaterally, no wheezing, rales,rhonchi or crepitation. No use of accessory muscles of respiration.  CARDIOVASCULAR: Regular rate and rhythm, S1, S2 normal. No murmurs, rubs, or gallops.  ABDOMEN: Soft, nondistended, nontender. Bowel sounds present. No organomegaly or mass.  EXTREMITIES: No pedal edema, cyanosis, or clubbing. Musculoskeletal:  Dressed left leg and knee with residual serosanguineous discharge. NEUROLOGIC: Generalized weakness with no focal deficit.  Gait not checked.  PSYCHIATRIC: The patient is alert and oriented x 3.  Normal affect and good eye contact. SKIN: No obvious rash, lesion, or ulcer.  LABORATORY PANEL:  Male CBC Recent Labs  Lab 08/24/18 0414  WBC 9.6  HGB 11.6*  HCT 36.3*  PLT 177   ------------------------------------------------------------------------------------------------------------------ Chemistries  Recent Labs  Lab 08/23/18 1935  08/25/18 0329  NA 139   < > 140  K 4.5   < > 4.0  CL 100   < > 106  CO2 30   < > 27  GLUCOSE 89   < > 102*  BUN 9   < > 8  CREATININE 0.95   < > 0.83  CALCIUM 9.4   < > 8.4*  MG  --   --  2.1  AST 288*  --   --   ALT 62*  --   --   ALKPHOS 48  --   --   BILITOT 0.9  --   --    < > = values in this interval not displayed.   RADIOLOGY:  No results found. ASSESSMENT AND PLAN:   1.  Acute rhabdomyolysis.  Being hydrated with IV fluids with gradual improvement in total CK level.  Down to about 9800.  Follow-up on CK level in a.m.  This could have been related to lack of movement after his left tibial plateau fracture.  2.  Sinus tachycardia.  This looks secondary to #1 with associated volume depletion.   Continue hydration with normal saline at 150 cc an hour.  Was given a dose of 2.5 mg of IV Lopressor recently.  Recent heart rate of 100.  Foley catheter was placed.  Will consider adding scheduled Lopressor if no resolution in a.m. TSH level normal  3.  Recent left tibial plateau fracture status post ORIF.  Orthopedic service following patient. Removed all compressive dressings.  And to apply negative pressure incisional dressing today. Continue to monitor pain, swelling.    4.  Ongoing tobacco abuse.  He was counseled for smoking cessation and will receive further counseling here.  5.  DVT prophylaxis.  Subcutaneous Lovenox.   All the  records are reviewed and case discussed with Care Management/Social Worker. Management plans discussed with the patient, family and they are in agreement.  CODE STATUS: Full Code  TOTAL TIME TAKING CARE OF THIS PATIENT:   3 patient had 4 greater than a liter minutes.   More than 50% of the time was spent in counseling/coordination of care: YES  POSSIBLE D/C IN 2 DAYS, DEPENDING ON CLINICAL CONDITION.   Shivank Pinedo M.D on 08/25/2018 at 3:49 PM  Between 7am to 6pm - Pager - [phone]  After 6pm go to [email or web address] - Proofreader  [facility] [place] [facility]  Office  [phone]  CC: Primary care physician; Patient, No Pcp Per  Note: This dictation was prepared with Dragon dictation along with smaller phrase technology. Any transcriptional errors that result from this process are unintentional.

## 2018-08-25 NOTE — Progress Notes (Signed)
   Subjective:    Patient reports pain as moderate.  Pain and swelling improving from yesterday Patient is well, and has had no acute complaints or problems Denies any CP, SOB, ABD pain.   Objective: Vital signs in last 24 hours: Temp:  [98.9 F (37.2 C)-100 F (37.8 C)] 98.9 F (37.2 C) (07/23 0732) Pulse Rate:  [95-113] 95 (07/23 0732) Resp:  [18] 18 (07/23 0732) BP: (141-158)/(85-90) 148/90 (07/23 0732) SpO2:  [100 %] 100 % (07/23 0732)  Intake/Output from previous day: 07/22 0701 - 07/23 0700 In: 2790 [P.O.:990; I.V.:1800] Out: 3425 [Urine:3425] Intake/Output this shift: Total I/O In: -  Out: 700 [Urine:700]  Recent Labs    08/23/18 1935 08/24/18 0414  HGB 14.2 11.6*   Recent Labs    08/23/18 1935 08/24/18 0414  WBC 8.6 9.6  RBC 4.72 3.88*  HCT 43.2 36.3*  PLT 195 177   Recent Labs    08/24/18 0414 08/25/18 0329  NA 140 140  K 4.8 4.0  CL 109 106  CO2 27 27  BUN 8 8  CREATININE 0.95 0.83  GLUCOSE 103* 102*  CALCIUM 8.1* 8.4*   No results for input(s): LABPT, INR in the last 72 hours.  EXAM General - Patient is Alert, Appropriate and Oriented Left lower extremity - Neurovascular intact Intact pulses distally No cellulitis present Compartment soft, tender.  No warmth or redness.  Swelling to the calf improving from yesterday Improved sensation dorsum of left foot.  Patient able to move toes well Unable to dorsiflex foot, consistent with preop consultation note. Dressing -Praveena intact with no drainage   History reviewed. No pertinent past medical history.  Assessment/Plan:      Active Problems:   Rhabdomyolysis  Estimated body mass index is 24.37 kg/m as calculated from the following:   Height as of this encounter: 5\' 6"  (1.676 m).   Weight as of this encounter: 68.5 kg.  Pain and swelling seem to be improving to the left leg.  Praveena dressing is intact with no drainage.  No signs of cellulitis, DVT or compartment  syndrome. Continue with Polar Care unit and Praveena dressing   T. Rachelle Hora, PA-C [place] 08/25/2018, 9:01 AM Wound vac applied

## 2018-08-26 LAB — CK: Total CK: 5941 U/L — ABNORMAL HIGH (ref 49–397)

## 2018-08-26 NOTE — Progress Notes (Signed)
   Subjective:    Patient reports pain as moderate.  Pain and swelling improving daily. Patient is well, and has had no acute complaints or problems Denies any CP, SOB, ABD pain.   Objective: Vital signs in last 24 hours: Temp:  [98.6 F (37 C)-99.7 F (37.6 C)] 98.6 F (37 C) (07/24 0730) Pulse Rate:  [95-131] 99 (07/24 0730) Resp:  [17-19] 18 (07/24 0730) BP: (153-166)/(83-93) 153/83 (07/24 0730) SpO2:  [100 %] 100 % (07/24 0730)  Intake/Output from previous day: 07/23 0701 - 07/24 0700 In: 565 [P.O.:565] Out: 4900 [Urine:4900] Intake/Output this shift: Total I/O In: -  Out: 640 [Urine:640]  Recent Labs    08/23/18 1935 08/24/18 0414  HGB 14.2 11.6*   Recent Labs    08/23/18 1935 08/24/18 0414  WBC 8.6 9.6  RBC 4.72 3.88*  HCT 43.2 36.3*  PLT 195 177   Recent Labs    08/24/18 0414 08/25/18 0329  NA 140 140  K 4.8 4.0  CL 109 106  CO2 27 27  BUN 8 8  CREATININE 0.95 0.83  GLUCOSE 103* 102*  CALCIUM 8.1* 8.4*   No results for input(s): LABPT, INR in the last 72 hours.  EXAM General - Patient is Alert, Appropriate and Oriented Left lower extremity - Neurovascular intact Intact pulses distally No cellulitis present Compartment soft, tender.  No warmth or redness.  Swelling to the calf improving. Normal sensation dorsum of left foot.  Patient able to move toes well Patient able to slightly dorsiflex foot which is improvement from yesterday. Dressing -Praveena intact with no drainage   History reviewed. No pertinent past medical history.  Assessment/Plan:      Active Problems:   Rhabdomyolysis  Estimated body mass index is 24.37 kg/m as calculated from the following:   Height as of this encounter: 5\' 6"  (1.676 m).   Weight as of this encounter: 68.5 kg.  Patient with improved pain, swelling, ankle dorsiflexion, and numbness to the left leg/ankle.    Praveena dressing is intact with no drainage.  No signs of cellulitis, DVT or compartment  syndrome.  Continue with Polar Care unit and Praveena dressing   T. Rachelle Hora, PA-C [place] 08/26/2018, 8:20 AM Wound vac applied

## 2018-08-26 NOTE — Progress Notes (Signed)
Marvin Roberts NAME: Marvin Roberts    MR#:  [id number]  DATE OF BIRTH:  1990-02-07  SUBJECTIVE:  CHIEF COMPLAINT:   Chief Complaint  Patient presents with  . Knee Pain   No new complaint this morning.  Resting comfortably. REVIEW OF SYSTEMS:  Review of Systems  Constitutional: Negative for chills and fever.  HENT: Negative for hearing loss and tinnitus.   Eyes: Negative for blurred vision and double vision.  Respiratory: Negative for cough and shortness of breath.   Cardiovascular: Negative for chest pain.  Gastrointestinal: Negative for heartburn and nausea.  Genitourinary: Negative for dysuria and urgency.  Musculoskeletal: Negative for back pain and myalgias.       Dressing in place over left knee joint  Skin: Negative for itching and rash.  Psychiatric/Behavioral: Negative for depression and suicidal ideas.    DRUG ALLERGIES:   Allergies  Allergen Reactions  . Coconut Oil Rash   VITALS:  Blood pressure (!) 156/99, pulse 100, temperature 99.2 F (37.3 C), temperature source Oral, resp. rate 17, height 5\' 6"  (1.676 m), weight 68.5 kg, SpO2 100 %. PHYSICAL EXAMINATION:  Physical Exam   GENERAL:  [name] lying in the bed with no acute distress.  EYES: Pupils equal, round, reactive to light and accommodation. No scleral icterus. Extraocular muscles intact.  HEENT: Head atraumatic, normocephalic. Oropharynx and nasopharynx clear.  NECK:  Supple, no jugular venous distention. No thyroid enlargement, no tenderness.  LUNGS: Normal breath sounds bilaterally, no wheezing, rales,rhonchi or crepitation. No use of accessory muscles of respiration.  CARDIOVASCULAR: Regular rate and rhythm, S1, S2 normal. No murmurs, rubs, or gallops.  ABDOMEN: Soft, nondistended, nontender. Bowel sounds present. No organomegaly or mass.  EXTREMITIES: No pedal edema, cyanosis, or clubbing. Musculoskeletal:  Dressed left leg and knee with residual serosanguineous discharge. NEUROLOGIC: Generalized weakness with no focal deficit.  Gait not checked.  PSYCHIATRIC: The patient is alert and oriented x 3.  Normal affect and good eye contact. SKIN: No obvious rash, lesion, or ulcer.  LABORATORY PANEL:  Male CBC Recent Labs  Lab 08/24/18 0414  WBC 9.6  HGB 11.6*  HCT 36.3*  PLT 177   ------------------------------------------------------------------------------------------------------------------ Chemistries  Recent Labs  Lab 08/23/18 1935  08/25/18 0329  NA 139   < > 140  K 4.5   < > 4.0  CL 100   < > 106  CO2 30   < > 27  GLUCOSE 89   < > 102*  BUN 9   < > 8  CREATININE 0.95   < > 0.83  CALCIUM 9.4   < > 8.4*  MG  --   --  2.1  AST 288*  --   --   ALT 62*  --   --   ALKPHOS 48  --   --   BILITOT 0.9  --   --    < > = values in this interval not displayed.   RADIOLOGY:  No results found. ASSESSMENT AND PLAN:   1.  Acute rhabdomyolysis.  Being hydrated with IV fluids with gradual improvement in total CK level.  Down 15,40 to  25941 this morning.   Follow-up on CK level in a.m.  This could have been related to lack of movement after his left tibial plateau fracture.  2.  Sinus tachycardia.  This looks secondary to #1 with associated volume depletion.   Continue hydration with normal saline at  150 cc an hour.  Heart rate improved to 100 with IV fluids TSH level normal  3.  Recent left tibial plateau fracture status post ORIF.  Orthopedic service following patient. Removed all compressive dressings.  And to apply negative pressure incisional dressing today. Continue to monitor pain, swelling.    4.  Ongoing tobacco abuse.  He was counseled for smoking cessation and will receive further counseling here.  5.  DVT prophylaxis.  Subcutaneous Lovenox.   All the records are reviewed and case discussed with Care Management/Social Worker. Management plans discussed with the  patient, family and they are in agreement.  CODE STATUS: Full Code  TOTAL TIME TAKING CARE OF THIS PATIENT: 35 minutes.   More than 50% of the time was spent in counseling/coordination of care: YES  POSSIBLE D/C IN 2 DAYS, DEPENDING ON CLINICAL CONDITION.   Vandella Ord M.D on 08/26/2018 at 3:30 PM  Between 7am to 6pm - Pager - [phone]  After 6pm go to [email or web address] - Social research officer, governmentpassword [email or web address]  [facility]  Office  [phone]  CC: Primary care physician; Patient, No Pcp Per  Note: This dictation was prepared with Dragon dictation along with smaller phrase technology. Any transcriptional errors that result from this process are unintentional.

## 2018-08-26 NOTE — Progress Notes (Signed)
Physical Therapy Treatment Patient Details Name: Marvin Roberts MRN: [id number] DOB: 1990-11-22 Today's Date: 08/26/2018    History of Present Illness Pt admitted for rhabdomyolysis with complaints of bleeding from surgical incision. Pt is s/p L ORIF of tibial plateau fx s/p fall while incarcerated. Pt conitnues to have severe pain limitations.    PT Comments    Pt in bed upon entry, agreeable to participate. Commenced with AA/ROM in bed to improve activity tolerance. Of note, pt has very poor Ankle Dorsiflexion activation, which translates to worse ergonomics during gait in terms of work required to maintain the LLE elevated for NWB. Pt still struggles with worse pain while sitting once foot is on floor, but gradually, activity is progressed to allow for flat foot weight bearing for 5x STS transfers. Pt eventually able to progress to Rt SLS with RW and Lt NWB for AMB attempt to BR. Pt successful with bowel movement, then balances against wall for hand hygiene at sink. Pt AMB back to bed, minA for LLE back into bed. Pt struggles with AMB but is not appropriate for [facility] use at this time. PT able to tolerate short distances with RW and will need a RW at DC to manage AMB while detained in jail.     Follow Up Recommendations  Outpatient PT     Equipment Recommendations  None recommended by PT    Recommendations for Other Services       Precautions / Restrictions Precautions Precautions: Fall Restrictions LLE Weight Bearing: Non weight bearing    Mobility  Bed Mobility Overal bed mobility: Modified Independent;Needs Assistance(labored and painful, but able to move LLE OOB without RLE self-assist. minA required for back into bed) Bed Mobility: Supine to Sit;Sit to Supine     Supine to sit: Modified independent (Device/Increase time) Sit to supine: Min assist      Transfers Overall transfer level: Modified independent Equipment used: Rolling walker (2 wheeled) Transfers: Sit to/from  Stand Sit to Stand: (maintains NWB as instructed)         General transfer comment: 5x from EOB with left foot supported; 3x from EOB with left foot on floor; 1x from toilet  Ambulation/Gait Ambulation/Gait assistance: Supervision Gait Distance (Feet): 25 Feet(13ftx2 to BR then back after Bowel Movement) Assistive device: Rolling walker (2 wheeled) Gait Pattern/deviations: (Rt leg hop-to pattern with RW; LLE floating)         Stairs             Wheelchair Mobility    Modified Rankin (Stroke Patients Only)       Balance Overall balance assessment: Modified Independent;No apparent balance deficits (not formally assessed)                                          Cognition Arousal/Alertness: Awake/alert Behavior During Therapy: WFL for tasks assessed/performed Overall Cognitive Status: Within Functional Limits for tasks assessed                                        Exercises General Exercises - Lower Extremity Ankle Circles/Pumps: AAROM;Left;20 reps;Supine(weak PF, difficult to detect ankle DFlexion) Short Arc Quad: AAROM;Left;20 reps;Supine Heel Slides: AAROM;Supine;Left;20 reps(fair activation, limited ROM tolerance) Hip ABduction/ADduction: AAROM;Supine;Left;20 reps(improved activation toward lateral half) Other Exercises Other Exercises: STS from EOB x7  General Comments        Pertinent Vitals/Pain Pain Assessment: 0-10 Pain Score: 7  Pain Location: L knee resting; much worse with movement and transfers Pain Intervention(s): Limited activity within patient's tolerance;Monitored during session;Premedicated before session;Repositioned;Relaxation;Patient requesting pain meds-RN notified    Home Living                      Prior Function            PT Goals (current goals can now be found in the care plan section) Acute Rehab PT Goals Patient Stated Goal: to leave the hospital PT Goal Formulation: With  patient Time For Goal Achievement: 09/07/18 Potential to Achieve Goals: Good Additional Goals Additional Goal #1: Pt will be able to perform bed mobility/transfers with mod I and safe technique in order to improve functional mobility. Progress towards PT goals: Progressing toward goals    Frequency    7X/week      PT Plan Current plan remains appropriate    Co-evaluation              AM-PAC PT "6 Clicks" Mobility   Outcome Measure  Help needed turning from your back to your side while in a flat bed without using bedrails?: None Help needed moving from lying on your back to sitting on the side of a flat bed without using bedrails?: None Help needed moving to and from a bed to a chair (including a wheelchair)?: A Little Help needed standing up from a chair using your arms (e.g., wheelchair or bedside chair)?: A Little Help needed to walk in hospital room?: A Lot Help needed climbing 3-5 steps with a railing? : A Lot 6 Click Score: 18    End of Session Equipment Utilized During Treatment: Gait belt Activity Tolerance: Patient limited by pain Patient left: in bed;with call bell/phone within reach;with family/visitor present Nurse Communication: Mobility status PT Visit Diagnosis: Muscle weakness (generalized) (M62.81);History of falling (Z91.81);Difficulty in walking, not elsewhere classified (R26.2);Pain Pain - Right/Left: Left Pain - part of body: Leg     Time: [id number] PT Time Calculation (min) (ACUTE ONLY): 53 min  Charges:  $Gait Training: 8-22 mins $Therapeutic Exercise: 38-52 mins                     11:03 AM, 08/26/18 Rosamaria Lints C , PT, DPT Physical Therapist [facility]  [phone] (ASCOM)    , C 08/26/2018, 10:57 AM

## 2018-08-27 LAB — MAGNESIUM: Magnesium: 2.4 mg/dL (ref 1.7–2.4)

## 2018-08-27 LAB — CBC
HCT: 29.9 % — ABNORMAL LOW (ref 39.0–52.0)
Hemoglobin: 9.5 g/dL — ABNORMAL LOW (ref 13.0–17.0)
MCH: 29.5 pg (ref 26.0–34.0)
MCHC: 31.8 g/dL (ref 30.0–36.0)
MCV: 92.9 fL (ref 80.0–100.0)
Platelets: 246 10*3/uL (ref 150–400)
RBC: 3.22 MIL/uL — ABNORMAL LOW (ref 4.22–5.81)
RDW: 13.8 % (ref 11.5–15.5)
WBC: 6.8 10*3/uL (ref 4.0–10.5)
nRBC: 0 % (ref 0.0–0.2)

## 2018-08-27 LAB — BASIC METABOLIC PANEL
Anion gap: 5 (ref 5–15)
BUN: 8 mg/dL (ref 6–20)
CO2: 28 mmol/L (ref 22–32)
Calcium: 8.5 mg/dL — ABNORMAL LOW (ref 8.9–10.3)
Chloride: 104 mmol/L (ref 98–111)
Creatinine, Ser: 0.73 mg/dL (ref 0.61–1.24)
GFR calc Af Amer: >60 mL/min (ref >60)
GFR calc non Af Amer: >60 mL/min (ref >60)
Glucose, Bld: 107 mg/dL — ABNORMAL HIGH (ref 70–99)
Potassium: 4.5 mmol/L (ref 3.5–5.1)
Sodium: 137 mmol/L (ref 135–145)

## 2018-08-27 LAB — CK: Total CK: 5801 U/L — ABNORMAL HIGH (ref 49–397)

## 2018-08-27 NOTE — Progress Notes (Signed)
   Subjective:    Patient reports pain as moderate.  Pain and swelling improving daily. Patient is well, and has had no acute complaints or problems Denies any CP, SOB, ABD pain.   Objective: Vital signs in last 24 hours: Temp:  [98.3 F (36.8 C)-99.2 F (37.3 C)] 98.3 F (36.8 C) (07/25 0746) Pulse Rate:  [95-141] 95 (07/25 0746) Resp:  [17] 17 (07/24 2311) BP: (142-159)/(88-99) 142/88 (07/25 0746) SpO2:  [100 %] 100 % (07/25 0746)  Intake/Output from previous day: 07/24 0701 - 07/25 0700 In: 720 [P.O.:720] Out: 4740 [Urine:4740] Intake/Output this shift: Total I/O In: 9143.2 [I.V.:9143.2] Out: -   Recent Labs    08/27/18 0432  HGB 9.5*   Recent Labs    08/27/18 0432  WBC 6.8  RBC 3.22*  HCT 29.9*  PLT 246   Recent Labs    08/25/18 0329 08/27/18 0432  NA 140 137  K 4.0 4.5  CL 106 104  CO2 27 28  BUN 8 8  CREATININE 0.83 0.73  GLUCOSE 102* 107*  CALCIUM 8.4* 8.5*   No results for input(s): LABPT, INR in the last 72 hours.  EXAM General - Patient is Alert, Appropriate and Oriented Left lower extremity - Neurovascular intact Intact pulses distally No cellulitis present Compartment soft, tender.  No warmth or redness.  Swelling to the calf improving. Normal sensation dorsum of left foot.  Patient able to move toes well Patient with mild flicker of ankle dorsiflexion which is improvement. Dressing -Praveena intact with no drainage   History reviewed. No pertinent past medical history.  Assessment/Plan:      Active Problems:   Rhabdomyolysis  Estimated body mass index is 24.37 kg/m as calculated from the following:   Height as of this encounter: 5\' 6"  (1.676 m).   Weight as of this encounter: 68.5 kg.  Patient with improved pain, swelling, minimal improvement of ankle dorsiflexion. Compartments are soft   Praveena dressing is intact with no drainage.  No signs of cellulitis, DVT or compartment syndrome.  Continue with Polar Care unit and  Praveena dressing   T. Rachelle Hora, PA-C [place] 08/27/2018, 9:00 AM Wound vac applied

## 2018-08-27 NOTE — Progress Notes (Signed)
Patient resting in bed, Guard at bedside. IV fluids infusing. Dressing clean dry and intact on left knee. No complaints at this time. Bed alarm on, call bell in reach.

## 2018-08-27 NOTE — Progress Notes (Signed)
Physical Therapy Treatment Patient Details Name: Marvin Roberts MRN: [id number] DOB: 07-09-1990 Today's Date: 08/27/2018    History of Present Illness Pt admitted for rhabdomyolysis with complaints of bleeding from surgical incision. Pt is s/p L ORIF of tibial plateau fx s/p fall while incarcerated. Pt conitnues to have severe pain limitations.    PT Comments    Pt resting upon entry, agreeable to participate. Pain remains ~7/10 resting and progresses to levels "untolerable" during sitting at EOB. Pain worse today than previous day, but similar to 2DA. Pain is a major hindrance to progressing mobility at this time, although pt remains motivated and diligent. Pt educated on self assist for bed exercises. Officer agreeable to help pt practice transfers later in day once additional pain meds have been received.    Follow Up Recommendations  Other (comment)(Will need whatever therapy services are available while incarcerated.)     Equipment Recommendations  Rolling walker with 5" wheels(will need a RW at DC. Pt has AC, but they ar enot appropiate at this time)    Recommendations for Other Services       Precautions / Restrictions Precautions Precautions: Fall Restrictions LLE Weight Bearing: Non weight bearing    Mobility  Bed Mobility Overal bed mobility: Modified Independent;Needs Assistance Bed Mobility: Supine to Sit;Sit to Supine     Supine to sit: Modified independent (Device/Increase time) Sit to supine: Min assist   General bed mobility comments: performs self assis tiwth leg hook; some assistance given while pt dons polarcare  Transfers Overall transfer level: Modified independent Equipment used: Rolling walker (2 wheeled) Transfers: Sit to/from Stand           General transfer comment: 5x from EOB with left foot supported; Pt remains much higher than prior day, very limiting, pt unable to lift leg in standing.  Ambulation/Gait Ambulation/Gait assistance: (Unable  to tolerate this date.)               Stairs             Wheelchair Mobility    Modified Rankin (Stroke Patients Only)       Balance Overall balance assessment: Modified Independent;No apparent balance deficits (not formally assessed)                                          Cognition Arousal/Alertness: Awake/alert Behavior During Therapy: WFL for tasks assessed/performed Overall Cognitive Status: Within Functional Limits for tasks assessed                                        Exercises General Exercises - Lower Extremity Ankle Circles/Pumps: AAROM;Left;20 reps;Supine Short Arc Quad: AAROM;Left;20 reps;Supine Heel Slides: AAROM;Supine;Left;20 reps;Limitations Heel Slides Limitations: Pt assisted, then shown self assist with be dsheet. Hip ABduction/ADduction: AAROM;Supine;Left;20 reps;Limitations Hip Abduction/Adduction Limitations: Pt assisted, then shown self assist with be dsheet.    General Comments        Pertinent Vitals/Pain      Home Living                      Prior Function            PT Goals (current goals can now be found in the care plan section) Acute Rehab PT Goals Patient Stated Goal: to leave  the hospital PT Goal Formulation: With patient Time For Goal Achievement: 09/07/18 Potential to Achieve Goals: Good Additional Goals Additional Goal #1: Pt will be able to perform bed mobility/transfers with mod I and safe technique in order to improve functional mobility. Progress towards PT goals: Progressing toward goals    Frequency    7X/week      PT Plan Current plan remains appropriate    Co-evaluation              AM-PAC PT "6 Clicks" Mobility   Outcome Measure  Help needed turning from your back to your side while in a flat bed without using bedrails?: None Help needed moving from lying on your back to sitting on the side of a flat bed without using bedrails?:  None Help needed moving to and from a bed to a chair (including a wheelchair)?: A Little Help needed standing up from a chair using your arms (e.g., wheelchair or bedside chair)?: A Little Help needed to walk in hospital room?: A Lot Help needed climbing 3-5 steps with a railing? : A Lot 6 Click Score: 18    End of Session Equipment Utilized During Treatment: Gait belt Activity Tolerance: Patient limited by pain Patient left: in bed;with call bell/phone within reach;with family/visitor present Nurse Communication: Mobility status PT Visit Diagnosis: Muscle weakness (generalized) (M62.81);History of falling (Z91.81);Difficulty in walking, not elsewhere classified (R26.2);Pain Pain - Right/Left: Left Pain - part of body: Leg     Time: [id number] PT Time Calculation (min) (ACUTE ONLY): 40 min  Charges:  $Therapeutic Exercise: 38-52 mins                     2:02 PM, 08/27/18 Rosamaria LintsAllan C Hyden Soley, PT, DPT Physical Therapist - [facility]  [phone] (ASCOM)    Bernard Slayden C 08/27/2018, 2:00 PM

## 2018-08-27 NOTE — Progress Notes (Signed)
[place] [facility] [place] NAME: Marvin Roberts    MR#:  [id number]  DATE OF BIRTH:  10/22/1990  SUBJECTIVE:   Patient still complaining of left knee pain, has a wound VAC in place.  Status post recent ORIF of a distal left tibial fracture.  Patient presented to the hospital this time due to acute rhabdomyolysis.  CKs are still over 5000.  REVIEW OF SYSTEMS:    Review of Systems  Constitutional: Negative for chills and fever.  HENT: Negative for congestion and tinnitus.   Eyes: Negative for blurred vision and double vision.  Respiratory: Negative for cough, shortness of breath and wheezing.   Cardiovascular: Negative for chest pain, orthopnea and PND.  Gastrointestinal: Negative for abdominal pain, diarrhea, nausea and vomiting.  Genitourinary: Negative for dysuria and hematuria.  Musculoskeletal: Positive for joint pain (left knee).  Neurological: Negative for dizziness, sensory change and focal weakness.  All other systems reviewed and are negative.   Nutrition: Regular diet Tolerating Diet: Yes Tolerating PT:  Eval noted.   DRUG ALLERGIES:   Allergies  Allergen Reactions  . Coconut Oil Rash    VITALS:  Blood pressure (!) 142/88, pulse (!) 131, temperature 98.3 F (36.8 C), temperature source Oral, resp. rate 17, height 5\' 6"  (1.676 m), weight 68.5 kg, SpO2 100 %.  PHYSICAL EXAMINATION:   Physical Exam  GENERAL:  28 y.o.-year-old patient lying in bed in no acute distress.  EYES: Pupils equal, round, reactive to light and accommodation. No scleral icterus. Extraocular muscles intact.  HEENT: Head atraumatic, normocephalic. Oropharynx and nasopharynx clear.  NECK:  Supple, no jugular venous distention. No thyroid enlargement, no tenderness.  LUNGS: Normal breath sounds bilaterally, no wheezing, rales, rhonchi. No use of accessory muscles of respiration.  CARDIOVASCULAR: S1, S2 normal. No murmurs, rubs, or gallops.  ABDOMEN:  Soft, nontender, nondistended. Bowel sounds present. No organomegaly or mass.  EXTREMITIES: No cyanosis, clubbing or edema b/l.  Left knee wound VAC in place. NEUROLOGIC: Cranial nerves II through XII are intact. No focal Motor or sensory deficits b/l.   PSYCHIATRIC: The patient is alert and oriented x 3.  SKIN: No obvious rash, lesion, or ulcer.    LABORATORY PANEL:   CBC Recent Labs  Lab 08/27/18 0432  WBC 6.8  HGB 9.5*  HCT 29.9*  PLT 246   ------------------------------------------------------------------------------------------------------------------  Chemistries  Recent Labs  Lab 08/23/18 1935  08/27/18 0432  NA 139   < > 137  K 4.5   < > 4.5  CL 100   < > 104  CO2 30   < > 28  GLUCOSE 89   < > 107*  BUN 9   < > 8  CREATININE 0.95   < > 0.73  CALCIUM 9.4   < > 8.5*  MG  --    < > 2.4  AST 288*  --   --   ALT 62*  --   --   ALKPHOS 48  --   --   BILITOT 0.9  --   --    < > = values in this interval not displayed.   ------------------------------------------------------------------------------------------------------------------  Cardiac Enzymes No results for input(s): TROPONINI in the last 168 hours. ------------------------------------------------------------------------------------------------------------------  RADIOLOGY:  No results found.   ASSESSMENT AND PLAN:   1. Acute rhabdomyolysis. -Secondary to poor mobility patient presented to the hospital with CK is over 20,000 and has improved with IV fluid hydration.  CK still remain  over 5000. -Continue IV fluid hydration, follow CKs.  Renal function is stable.  2. Sinus tachycardia.This looks secondary to #1 with associated volume depletion.  Continue hydration with normal saline at 150 cc an hour.   -Heart rate has improved.  3.Recent left tibial plateau fracture status post ORIF. Orthopedic service following patient.  -Status post wound VAC placement as per orthopedics.  Continue pain  control and further care as per them.    All the records are reviewed and case discussed with Care Management/Social Worker. Management plans discussed with the patient, family and they are in agreement.  CODE STATUS: Full code  DVT Prophylaxis: Lovenox  TOTAL TIME TAKING CARE OF THIS PATIENT: 30 minutes.   POSSIBLE D/C IN 2-3 DAYS, DEPENDING ON CLINICAL CONDITION.   Houston SirenVivek J  M.D on 08/27/2018 at 2:26 PM  Between 7am to 6pm - Pager - [phone]  After 6pm go to [email or web address] - Social research officer, governmentpassword [email or web address] [email or web address]  [facility]  Office  [phone]  CC: Primary care physician; Patient, No Pcp Per

## 2018-08-28 LAB — CULTURE, BLOOD (ROUTINE X 2)
Culture: NO GROWTH
Culture: NO GROWTH
Special Requests: ADEQUATE

## 2018-08-28 LAB — CK: Total CK: 4359 U/L — ABNORMAL HIGH (ref 49–397)

## 2018-08-28 NOTE — Progress Notes (Signed)
Physical Therapy Treatment Patient Details Name: Marvin NeitherHyquan Morici MRN: [id number] DOB: 03/06/1990 Today's Date: 08/28/2018    History of Present Illness Pt admitted for rhabdomyolysis with complaints of bleeding from surgical incision. Pt is s/p L ORIF of tibial plateau fx s/p fall while incarcerated. Pt conitnues to have severe pain limitations.    PT Comments    Pt asleep in bed upon entry, pt reports sleepy after getting pain meds earlier. Chartered loss adjusterAuthor coordinated with RN to assure pain meds prior to session as patient has been limited in progressing mobility 2/2 high pain levels over last several days. Pt moves to EOB without assist, performs STS transfers 12x from EOB with RW to warm-up and loosen his LLE which helps with pain. Pt able to AMB to door and back 3x consecutively for a distance over 6190ft. Severe pain increase after AMB, pt returned to supine, leg elevated, pt independent with application of polar care and additional elevation as needed. Deputy assisting with plugging wound vac and IV power cords back. Pt still pain limited but able to progress mobility fairly well. Deputy reports he will not likely need to AMB this far once he returns to jail. Author explained that it is reasonable and safe to have an AMB tolerance that is far greater than a realistic daily requirement, as it is not safe to perform maximal tolerated distance each time ADL need be performed, as an adverse event is more likely to occur if the patient is mobilizing in a fatigued state. HR remains elevated (126bpm) after AMB, but better controlled at rest than past few days.    Follow Up Recommendations  Other (comment)([facility] services [facility])     Equipment Recommendations  Rolling walker with 5" wheels(will need a RW for return to jail)    Recommendations for Other Services       Precautions / Restrictions Precautions Precautions: Fall Restrictions LLE Weight Bearing: Non weight bearing     Mobility  Bed Mobility Overal bed mobility: Needs Assistance Bed Mobility: Supine to Sit;Sit to Supine     Supine to sit: Modified independent (Device/Increase time) Sit to supine: Min assist   General bed mobility comments: performs self assist wth leg hook OOB; some assistance givenfor LLE back into bed  Transfers Overall transfer level: Modified independent Equipment used: Rolling walker (2 wheeled) Transfers: Sit to/from Stand           General transfer comment: 12x from EOB with left foot supported; Pt elects to do this to help with pain management prior to AMB trial  Ambulation/Gait Ambulation/Gait assistance: Supervision Gait Distance (Feet): 90 Feet Assistive device: Rolling walker (2 wheeled)       General Gait Details: Rt leg 2-point hop-to gait with RW, 3x to door and back, has planned on AMB into hallway, but deputy not agreeable to this. significan tpain increase after AMB   Stairs             Wheelchair Mobility    Modified Rankin (Stroke Patients Only)       Balance                                            Cognition Arousal/Alertness: Awake/alert Behavior During Therapy: WFL for tasks assessed/performed Overall Cognitive Status: Within Functional Limits for tasks assessed  Exercises      General Comments        Pertinent Vitals/Pain Pain Assessment: 0-10 Pain Score: 7 (much worse after AMB) Pain Location: Lt knee during sitting EOB Pain Descriptors / Indicators: Operative site guarding Pain Intervention(s): Limited activity within patient's tolerance;Monitored during session;Premedicated before session;Repositioned;Ice applied;Relaxation    Home Living                      Prior Function            PT Goals (current goals can now be found in the care plan section) Acute Rehab PT Goals Patient Stated Goal: to leave the hospital PT Goal  Formulation: With patient Time For Goal Achievement: 09/07/18 Potential to Achieve Goals: Good Additional Goals Additional Goal #1: Pt will be able to perform bed mobility/transfers with mod I and safe technique in order to improve functional mobility. Progress towards PT goals: Progressing toward goals    Frequency    7X/week      PT Plan Current plan remains appropriate    Co-evaluation              AM-PAC PT "6 Clicks" Mobility   Outcome Measure  Help needed turning from your back to your side while in a flat bed without using bedrails?: None Help needed moving from lying on your back to sitting on the side of a flat bed without using bedrails?: None Help needed moving to and from a bed to a chair (including a wheelchair)?: A Little Help needed standing up from a chair using your arms (e.g., wheelchair or bedside chair)?: A Little Help needed to walk in hospital room?: A Little Help needed climbing 3-5 steps with a railing? : A Lot 6 Click Score: 19    End of Session Equipment Utilized During Treatment: Gait belt Activity Tolerance: Patient limited by pain Patient left: in bed;with call bell/phone within reach;with family/visitor present Nurse Communication: Mobility status PT Visit Diagnosis: Muscle weakness (generalized) (M62.81);History of falling (Z91.81);Difficulty in walking, not elsewhere classified (R26.2);Pain Pain - Right/Left: Left Pain - part of body: Leg     Time: 0539-7673 PT Time Calculation (min) (ACUTE ONLY): 32 min  Charges:  $Gait Training: 23-37 mins                     4:39 PM, 08/28/18 Etta Grandchild, PT, DPT Physical Therapist - [facility]  [phone] ([place])    [place] C 08/28/2018, 4:33 PM

## 2018-08-28 NOTE — Progress Notes (Signed)
   Subjective:    Patient reports pain as 8/10.  Left knee pain and swelling improving.  Denies any calf pain. Patient is well, and has had no acute complaints or problems Denies any CP, SOB, ABD pain.   Objective: Vital signs in last 24 hours: Temp:  [98.8 F (37.1 C)-99 F (37.2 C)] 99 F (37.2 C) (07/26 0824) Pulse Rate:  [100-131] 101 (07/26 0824) Resp:  [18] 18 (07/25 2321) BP: (152-158)/(80-95) 153/95 (07/26 0824) SpO2:  [100 %] 100 % (07/26 0824)  Intake/Output from previous day: 07/25 0701 - 07/26 0700 In: 11407.7 [P.O.:480; I.V.:10927.7] Out: 3600 [Urine:3600] Intake/Output this shift: Total I/O In: 1710.2 [I.V.:1710.2] Out: -   Recent Labs    08/27/18 0432  HGB 9.5*   Recent Labs    08/27/18 0432  WBC 6.8  RBC 3.22*  HCT 29.9*  PLT 246   Recent Labs    08/27/18 0432  NA 137  K 4.5  CL 104  CO2 28  BUN 8  CREATININE 0.73  GLUCOSE 107*  CALCIUM 8.5*   No results for input(s): LABPT, INR in the last 72 hours.  EXAM General - Patient is Alert, Appropriate and Oriented Left lower extremity - Intact pulses distally No cellulitis present Compartment soft, minimally tender.   No warmth or redness.  Mild pitting edema left lower tibia, ankle and foot Normal sensation dorsum of left foot.  Patient able to move toes well Patient with mild flicker of ankle dorsiflexion which is improvement.  Dressing -Praveena intact with no drainage   History reviewed. No pertinent past medical history.  Assessment/Plan:      Active Problems:   Rhabdomyolysis  Estimated body mass index is 24.37 kg/m as calculated from the following:   Height as of this encounter: 5\' 6"  (1.676 m).   Weight as of this encounter: 68.5 kg.  Patient with improved left knee pain, swelling.  Minimal improvement of ankle dorsiflexion. Compartments are soft.  Mild pitting edema left foot ankle and lower tib-fib region.  Will apply compression stocking and add foot pumps  today.  Praveena dressing is intact with no drainage.  No signs of cellulitis, DVT or compartment syndrome.  Continue with Polar Care unit and Praveena dressing  CK levels improving daily.  Patient will need follow-up 2 weeks postop with [facility] orthopedics for staple removal Steri-Strip and x-rays  Continue with physical therapy, nonweightbearing left lower extremity   T. Rachelle Hora, PA-C [place] 08/28/2018, 8:30 AM Wound vac applied

## 2018-08-28 NOTE — Progress Notes (Signed)
[facility] [place] [facility]     PATIENT NAME: Marvin Roberts    MR#:  [id number]  DATE OF BIRTH:  05/21/1990  SUBJECTIVE:   CKs are improving, left knee pain is improved.  No other acute events overnight.  REVIEW OF SYSTEMS:    Review of Systems  Constitutional: Negative for chills and fever.  HENT: Negative for congestion and tinnitus.   Eyes: Negative for blurred vision and double vision.  Respiratory: Negative for cough, shortness of breath and wheezing.   Cardiovascular: Negative for chest pain, orthopnea and PND.  Gastrointestinal: Negative for abdominal pain, diarrhea, nausea and vomiting.  Genitourinary: Negative for dysuria and hematuria.  Musculoskeletal: Positive for joint pain (left knee).  Neurological: Negative for dizziness, sensory change and focal weakness.  All other systems reviewed and are negative.   Nutrition: Regular diet Tolerating Diet: Yes Tolerating PT:  Eval noted.   DRUG ALLERGIES:   Allergies  Allergen Reactions  . Coconut Oil Rash    VITALS:  Blood pressure (!) 153/95, pulse (!) 101, temperature 99 F (37.2 C), temperature source Oral, resp. rate 18, height 5\' 6"  (1.676 m), weight 68.5 kg, SpO2 100 %.  PHYSICAL EXAMINATION:   Physical Exam  GENERAL:  28 y.o.-year-old patient lying in bed in no acute distress.  EYES: Pupils equal, round, reactive to light and accommodation. No scleral icterus. Extraocular muscles intact.  HEENT: Head atraumatic, normocephalic. Oropharynx and nasopharynx clear.  NECK:  Supple, no jugular venous distention. No thyroid enlargement, no tenderness.  LUNGS: Normal breath sounds bilaterally, no wheezing, rales, rhonchi. No use of accessory muscles of respiration.  CARDIOVASCULAR: S1, S2 normal. No murmurs, rubs, or gallops.  ABDOMEN: Soft, nontender, nondistended. Bowel sounds present. No organomegaly or mass.  EXTREMITIES: No cyanosis, clubbing or edema b/l.  Left knee wound VAC in  place. NEUROLOGIC: Cranial nerves II through XII are intact. No focal Motor or sensory deficits b/l.   PSYCHIATRIC: The patient is alert and oriented x 3.  SKIN: No obvious rash, lesion, or ulcer.    LABORATORY PANEL:   CBC Recent Labs  Lab 08/27/18 0432  WBC 6.8  HGB 9.5*  HCT 29.9*  PLT 246   ------------------------------------------------------------------------------------------------------------------  Chemistries  Recent Labs  Lab 08/23/18 1935  08/27/18 0432  NA 139   < > 137  K 4.5   < > 4.5  CL 100   < > 104  CO2 30   < > 28  GLUCOSE 89   < > 107*  BUN 9   < > 8  CREATININE 0.95   < > 0.73  CALCIUM 9.4   < > 8.5*  MG  --    < > 2.4  AST 288*  --   --   ALT 62*  --   --   ALKPHOS 48  --   --   BILITOT 0.9  --   --    < > = values in this interval not displayed.   ------------------------------------------------------------------------------------------------------------------  Cardiac Enzymes No results for input(s): TROPONINI in the last 168 hours. ------------------------------------------------------------------------------------------------------------------  RADIOLOGY:  No results found.   ASSESSMENT AND PLAN:   1. Acute rhabdomyolysis. -Secondary to poor mobility patient presented to the hospital with CK is over 20,000 and has improved with IV fluid hydration.  CK's down to 4300 today and will cont. To monitor. -Continue IV fluid hydration, follow CKs.  Renal function is stable.  2. Sinus tachycardia.This looks secondary to #1 with associated  volume depletion.  Continue hydration with normal saline at 150 cc an hour.   -Heart rate stable.   3.Recent left tibial plateau fracture status post ORIF. Orthopedic service following patient.  -Status post wound VAC placement as per orthopedics.  Continue pain control and further care as per them. Cont. PT as tolerated and non-weight bearing on left leg.      All the records are  reviewed and case discussed with Care Management/Social Worker. Management plans discussed with the patient, family and they are in agreement.  CODE STATUS: Full code  DVT Prophylaxis: Lovenox  TOTAL TIME TAKING CARE OF THIS PATIENT: 25 minutes.   POSSIBLE D/C IN 2-3 DAYS, DEPENDING ON CLINICAL CONDITION.   Henreitta Leber M.D on 08/28/2018 at 1:39 PM  Between 7am to 6pm - Pager - [phone]  After 6pm go to [email or web address] - Proofreader  [facility]  Office  [phone]  CC: Primary care physician; Patient, No Pcp Per

## 2018-08-28 NOTE — Plan of Care (Signed)
  Problem: Education: Goal: Knowledge of General Education information will improve Description: Including pain rating scale, medication(s)/side effects and non-pharmacologic comfort measures Outcome: Progressing   Problem: Health Behavior/Discharge Planning: Goal: Ability to manage health-related needs will improve Outcome: Progressing   Problem: Clinical Measurements: Goal: Ability to maintain clinical measurements within normal limits will improve Outcome: Progressing Goal: Will remain free from infection Outcome: Progressing Goal: Diagnostic test results will improve Outcome: Progressing Goal: Cardiovascular complication will be avoided Outcome: Progressing   Problem: Activity: Goal: Risk for activity intolerance will decrease Outcome: Progressing   Problem: Nutrition: Goal: Adequate nutrition will be maintained Outcome: Progressing   Problem: Elimination: Goal: Will not experience complications related to bowel motility Outcome: Progressing Goal: Will not experience complications related to urinary retention Outcome: Progressing   Problem: Pain Managment: Goal: General experience of comfort will improve Outcome: Progressing   

## 2018-08-29 ENCOUNTER — Inpatient Hospital Stay

## 2018-08-29 LAB — CK: Total CK: 3225 U/L — ABNORMAL HIGH (ref 49–397)

## 2018-08-29 MED ORDER — AMLODIPINE BESYLATE 5 MG PO TABS: 5.0000 mg | ORAL_TABLET | Freq: Every day | ORAL | Status: DC

## 2018-08-29 MED ORDER — LISINOPRIL 10 MG PO TABS
10.0000 mg | ORAL_TABLET | Freq: Every day | ORAL | Status: DC
  Administered 2018-08-30: 10:00:00 10 mg via ORAL
  Filled 2018-08-29: qty 1

## 2018-08-29 NOTE — Progress Notes (Signed)
PT Cancellation Note  Patient Details Name: Marvin Roberts MRN: [id number] DOB: 11/12/1990   Cancelled Treatment:    Reason Eval/Treat Not Completed: Other (comment). Treatment attempted; pt currently eating lunch. Pt reports he has been working on and exercise for the past 10 minutes and demonstrates a very limited self assisted heel slide. Pt states attempted walking is too hard. Offered to pt that therapist would return post lunch and treat progressing exercises as able.    Larae Grooms, PTA 08/29/2018, 12:33 PM

## 2018-08-29 NOTE — Progress Notes (Signed)
[facility] [place] NAME: Marvin Roberts    MR#:  [id number]  DATE OF BIRTH:  [birth year]  SUBJECTIVE:   CKs are improving, left knee pain improving.  No other acute events overnight.  REVIEW OF SYSTEMS:    Review of Systems  Constitutional: Negative for chills and fever.  HENT: Negative for congestion and tinnitus.   Eyes: Negative for blurred vision and double vision.  Respiratory: Negative for cough, shortness of breath and wheezing.   Cardiovascular: Negative for chest pain, orthopnea and PND.  Gastrointestinal: Negative for abdominal pain, diarrhea, nausea and vomiting.  Genitourinary: Negative for dysuria and hematuria.  Musculoskeletal: Positive for joint pain (left knee).  Neurological: Negative for dizziness, sensory change and focal weakness.  All other systems reviewed and are negative.   Nutrition: Regular diet Tolerating Diet: Yes Tolerating PT:  Eval noted.   DRUG ALLERGIES:   Allergies  Allergen Reactions  . Coconut Oil Rash    VITALS:  Blood pressure (!) 153/94, pulse (!) 104, temperature 99 F (37.2 C), temperature source Oral, resp. rate 17, height 5\' 6"  (1.676 m), weight 68.5 kg, SpO2 100 %.  PHYSICAL EXAMINATION:   Physical Exam  GENERAL:  28 y.o.-year-old patient lying in bed in no acute distress.  EYES: Pupils equal, round, reactive to light and accommodation. No scleral icterus. Extraocular muscles intact.  HEENT: Head atraumatic, normocephalic. Oropharynx and nasopharynx clear.  NECK:  Supple, no jugular venous distention. No thyroid enlargement, no tenderness.  LUNGS: Normal breath sounds bilaterally, no wheezing, rales, rhonchi. No use of accessory muscles of respiration.  CARDIOVASCULAR: S1, S2 normal. No murmurs, rubs, or gallops.  ABDOMEN: Soft, nontender, nondistended. Bowel sounds present. No organomegaly or mass.  EXTREMITIES: No cyanosis, clubbing or edema b/l.  Left knee wound VAC in  place.   NEUROLOGIC: Cranial nerves II through XII are intact. No focal Motor or sensory deficits b/l.   PSYCHIATRIC: The patient is alert and oriented x 3.  SKIN: No obvious rash, lesion, or ulcer.    LABORATORY PANEL:   CBC Recent Labs  Lab 08/27/18 0432  WBC 6.8  HGB 9.5*  HCT 29.9*  PLT 246   ------------------------------------------------------------------------------------------------------------------  Chemistries  Recent Labs  Lab 08/23/18 1935  08/27/18 0432  NA 139   < > 137  K 4.5   < > 4.5  CL 100   < > 104  CO2 30   < > 28  GLUCOSE 89   < > 107*  BUN 9   < > 8  CREATININE 0.95   < > 0.73  CALCIUM 9.4   < > 8.5*  MG  --    < > 2.4  AST 288*  --   --   ALT 62*  --   --   ALKPHOS 48  --   --   BILITOT 0.9  --   --    < > = values in this interval not displayed.   ------------------------------------------------------------------------------------------------------------------  Cardiac Enzymes No results for input(s): TROPONINI in the last 168 hours. ------------------------------------------------------------------------------------------------------------------  RADIOLOGY:  US Venous Img Lower Unilateral Left  Result Date: 08/29/2018 CLINICAL DATA:  Left lower extremity pain and edema. History of smoking. History of prior DVT. Recent ORIF of tibial plateau fracture. Evaluate for acute or chronic DVT. EXAM: LEFT LOWER EXTREMITY VENOUS DOPPLER ULTRASOUND TECHNIQUE: Gray-scale sonography with graded compression, as well as color Doppler and duplex ultrasound were performed to evaluate the lower  extremity deep venous systems from the level of the common femoral vein and including the common femoral, femoral, profunda femoral, popliteal and calf veins including the posterior tibial, peroneal and gastrocnemius veins when visible. The superficial great saphenous vein was also interrogated. Spectral Doppler was utilized to evaluate flow at rest and with distal  augmentation maneuvers in the common femoral, femoral and popliteal veins. COMPARISON:  Left knee radiographs-08/23/2018 FINDINGS: Contralateral Common Femoral Vein: Respiratory phasicity is normal and symmetric with the symptomatic side. No evidence of thrombus. Normal compressibility. Common Femoral Vein: No evidence of thrombus. Normal compressibility, respiratory phasicity and response to augmentation. Saphenofemoral Junction: No evidence of thrombus. Normal compressibility and flow on color Doppler imaging. Profunda Femoral Vein: No evidence of thrombus. Normal compressibility and flow on color Doppler imaging. Femoral Vein: No evidence of thrombus. Normal compressibility, respiratory phasicity and response to augmentation. Popliteal Vein: No evidence of thrombus. Normal compressibility, respiratory phasicity and response to augmentation. Calf Veins: No evidence of thrombus. Normal compressibility and flow on color Doppler imaging. Superficial Great Saphenous Vein: No evidence of thrombus. Normal compressibility. Venous Reflux:  None. Other Findings:  None. IMPRESSION: No evidence of DVT within the left lower extremity. Electronically Signed   By: Simonne ComeJohn  Watts M.D.   On: 08/29/2018 09:54     ASSESSMENT AND PLAN:   1. Acute rhabdomyolysis. -Secondary to poor mobility patient presented to the hospital with CK is over 20,000 and has improved with IV fluid hydration.  CK's down to 3200 today and will cont. To monitor. -Continue IV fluid hydration, follow CKs.  Renal function is stable.  2. Sinus tachycardia.This looks secondary to #1 with associated volume depletion.  Continue hydration with normal saline at 150 cc an hour.   -Heart rate stable.   3.Recent left tibial plateau fracture status post ORIF. Orthopedic service following patient.  -Status post wound VAC placement as per orthopedics.  Continue pain control and further care as per them. Cont. PT as tolerated and non-weight bearing  on left leg.    [facility] prison tomorrow if CK's are trending down. Discussed with Care Management.   All the records are reviewed and case discussed with Care Management/Social Worker. Management plans discussed with the patient, family and they are in agreement.  CODE STATUS: Full code  DVT Prophylaxis: Lovenox  TOTAL TIME TAKING CARE OF THIS PATIENT: 25 minutes.   POSSIBLE D/C IN 1-2 DAYS, DEPENDING ON CLINICAL CONDITION.   Houston SirenVivek J  M.D on 08/29/2018 at 2:50 PM  Between 7am to 6pm - Pager - [phone]  After 6pm go to [email or web address] - Social research officer, governmentpassword EPAS [email or web address]  [facility]  Office  [phone]  CC: Primary care physician; Patient, No Pcp Per

## 2018-08-29 NOTE — Progress Notes (Signed)
   Subjective:    Patient reports pain as 8/10.  Left knee pain and swelling improving.  Complaining of calf pain.Patient is well, and has had no acute complaints or problems Denies any hx of blood clots, cough, CP, SOB, ABD pain.   Objective: Vital signs in last 24 hours: Temp:  [98.5 F (36.9 C)-99 F (37.2 C)] 99 F (37.2 C) (07/27 0727) Pulse Rate:  [98-126] 104 (07/27 0727) Resp:  [17-18] 17 (07/27 0727) BP: (152-166)/(94-105) 153/94 (07/27 0727) SpO2:  [99 %-100 %] 100 % (07/27 0727)  Intake/Output from previous day: 07/26 0701 - 07/27 0700 In: 5239.1 [P.O.:720; I.V.:4519.1] Out: 2950 [Urine:2950] Intake/Output this shift: No intake/output data recorded.  Recent Labs    08/27/18 0432  HGB 9.5*   Recent Labs    08/27/18 0432  WBC 6.8  RBC 3.22*  HCT 29.9*  PLT 246   Recent Labs    08/27/18 0432  NA 137  K 4.5  CL 104  CO2 28  BUN 8  CREATININE 0.73  GLUCOSE 107*  CALCIUM 8.5*   No results for input(s): LABPT, INR in the last 72 hours.  EXAM General - Patient is Alert, Appropriate and Oriented Left lower extremity - Intact pulses distally No cellulitis present Compartment soft,  Calf tender   No warmth or redness.  Mild pitting edema left lower tibia, ankle and foot Normal sensation dorsum of left foot.  Patient able to move toes well Patient with mild flicker of ankle dorsiflexion which is improvement. Dressing -Praveena intact with no drainage   History reviewed. No pertinent past medical history.  Assessment/Plan:      Active Problems:   Rhabdomyolysis  Estimated body mass index is 24.37 kg/m as calculated from the following:   Height as of this encounter: 5\' 6"  (1.676 m).   Weight as of this encounter: 68.5 kg.  Patient with improved left knee pain, swelling.  Minimal improvement of ankle dorsiflexion.  Mild pitting edema left foot ankle and lower tib-fib region.  Complaining of left calf pain with increased tenderness on exam  today.  Will order ultrasound left lower extremity.  Praveena dressing is intact with no drainage.  No signs of cellulitis, DVT or compartment syndrome.  Continue with Polar Care unit and Praveena dressing  CK levels improving daily.  Patient will need follow-up 2 weeks postop with [facility]oped[facility] for staple removal Steri-Strip and x-rays  Continue with physical therapy, nonweightbearing left lower extremity   T. Rachelle Hora, PA-C [place] 08/29/2018, 8:20 AM Wound vac applied

## 2018-08-29 NOTE — Progress Notes (Signed)
Physical Therapy Treatment Patient Details Name: Marvin Roberts MRN: [id number] DOB: 09/06/1990 Today's Date: 08/29/2018    History of Present Illness Pt admitted for rhabdomyolysis with complaints of bleeding from surgical incision. Pt is s/p L ORIF of tibial plateau fx s/p fall while incarcerated. Pt conitnues to have severe pain limitations.    PT Comments    Pt agreeable to PT; notes significant pain when LLE in dependent position. Session spent seated edge of bed or standing performing exercise in between pt need for rest periods and "tending" to LLE. Pt reports hard feeling medial proximal calf and posterior knee. Educated pt on use of polar care alternating between front and back of knee as well as true elevation and use of ankle pump. Pt performs all bed mobility independently and STS with rolling walker with Mod I. Spoke with nurse regarding TED hose (pt states increased swelling; currently not on L), pt mobility status and session concerns. Continue PT to progress range, strength and functional mobility.    Follow Up Recommendations  Other (comment)(services available while incarcerated)     Equipment Recommendations       Recommendations for Other Services       Precautions / Restrictions Precautions Precautions: Fall Restrictions Weight Bearing Restrictions: Yes LLE Weight Bearing: Non weight bearing    Mobility  Bed Mobility Overal bed mobility: Modified Independent Bed Mobility: Supine to Sit;Sit to Supine     Supine to sit: Modified independent (Device/Increase time) Sit to supine: Modified independent (Device/Increase time)   General bed mobility comments: use of rail to sit; manages LLE well  Transfers Overall transfer level: Modified independent Equipment used: Rolling walker (2 wheeled) Transfers: Sit to/from Stand Sit to Stand: Supervision         General transfer comment: STS without assist; does not demonstrate full upright stance due to reacting  to pain in LLE, but is able   Ambulation/Gait             General Gait Details: ultimately declines ambulation due to pain. Stands numerous times with some performance of exercises, but only stands for up to a minute at a time before choosing to sit.    Stairs             Wheelchair Mobility    Modified Rankin (Stroke Patients Only)       Balance                                            Cognition Arousal/Alertness: Awake/alert Behavior During Therapy: WFL for tasks assessed/performed Overall Cognitive Status: Within Functional Limits for tasks assessed                                        Exercises General Exercises - Lower Extremity Ankle Circles/Pumps: AROM;AAROM;Left;Both;Seated;Other (comment)(performs for 5-6 minutes in sit with + without sheet for A) Quad Sets: Strengthening;Left;10 reps Long Arc Quad: AAROM;Left;10 reps;Seated(2 sets) Heel Slides: AAROM;Left;10 reps Hip ABduction/ADduction: AROM;Strengthening;10 reps;Standing Straight Leg Raises: Strengthening;Left;10 reps Hip Flexion/Marching: Strengthening;Left;10 reps;Standing(2 sets; also 2 sets seated working on gravity A knee flex) Other Exercises Other Exercises: STS numerous times. Static stand  Other Exercises: Educated on use of polar care on posterior knee Other Exercises: Educated on true elevation    General Comments  Pertinent Vitals/Pain Pain Assessment: Faces Faces Pain Scale: Hurts whole lot Pain Location: Left knee/distal LE with dependent position; alleviates with elevation Pain Descriptors / Indicators: Operative site guarding;Grimacing;Throbbing Pain Intervention(s): Limited activity within patient's tolerance;Monitored during session;Ice applied    Home Living                      Prior Function            PT Goals (current goals can now be found in the care plan section) Progress towards PT goals: Progressing toward  goals    Frequency    7X/week      PT Plan Current plan remains appropriate    Co-evaluation              AM-PAC PT "6 Clicks" Mobility   Outcome Measure  Help needed turning from your back to your side while in a flat bed without using bedrails?: None Help needed moving from lying on your back to sitting on the side of a flat bed without using bedrails?: None Help needed moving to and from a bed to a chair (including a wheelchair)?: A Little Help needed standing up from a chair using your arms (e.g., wheelchair or bedside chair)?: None Help needed to walk in hospital room?: A Little Help needed climbing 3-5 steps with a railing? : A Lot 6 Click Score: 20    End of Session Equipment Utilized During Treatment: Gait belt Activity Tolerance: Patient limited by pain Patient left: in bed;with call bell/phone within reach;with SCD's reapplied;Other (comment)(polar care in place; guard in room)   PT Visit Diagnosis: Muscle weakness (generalized) (M62.81);History of falling (Z91.81);Difficulty in walking, not elsewhere classified (R26.2);Pain Pain - Right/Left: Left Pain - part of body: Leg     Time:  -     Charges:  $Therapeutic Exercise: 23-37 mins                      Larae Grooms, PTA 08/29/2018, 3:23 PM

## 2018-08-29 NOTE — Progress Notes (Addendum)
PT Cancellation Note  Patient Details Name: Marvin Roberts MRN: [id number] DOB: 02/21/1990   Cancelled Treatment:    Reason Eval/Treat Not Completed: Other (comment). Per orthopedic noted, pt complaining of L calf pain and; MD to place order for doppler US. Will hold until pt test complete of proven negative for DVT. Continue as appropriate pending test results.   Just spoke with Dr. Rudene Christians, pt cleared from DVT.    Larae Grooms, PTA 08/29/2018, 12:12 PM

## 2018-08-30 LAB — CK: Total CK: 1965 U/L — ABNORMAL HIGH (ref 49–397)

## 2018-08-30 MED ORDER — TRAMADOL HCL 50 MG PO TABS: 50.0000 mg | ORAL_TABLET | Freq: Four times a day (QID) | ORAL | 0 refills | Status: AC

## 2018-08-30 MED ORDER — ASPIRIN EC 325 MG PO TBEC: 325.0000 mg | DELAYED_RELEASE_TABLET | Freq: Every day | ORAL | 0 refills | Status: AC

## 2018-08-30 MED ORDER — HYDROCODONE-ACETAMINOPHEN 5-325 MG PO TABS: 1.0000 | ORAL_TABLET | Freq: Four times a day (QID) | ORAL | 0 refills | Status: AC | PRN

## 2018-08-30 MED ORDER — LISINOPRIL 10 MG PO TABS: 10.0000 mg | ORAL_TABLET | Freq: Every day | ORAL | 1 refills | Status: AC

## 2018-08-30 NOTE — TOC Progression Note (Signed)
Transition of Care [facility]) - Progression Note    Patient Details  Name: Marvin Roberts MRN: [id number] Date of Birth: [birth year]  [facility] CM/SW Contact  Su Hilt, RN Phone Number: 08/30/2018, 9:34 AM  Clinical Narrative:     [place] at [phone] and spoke to Medical, the nurse stated that the [facility] and speak with the Doctor theire to get the inmate accepted, once that is done, The physician here would need to contact the physician there at [phone] and have a peer to peer discussion, then the [place] [facility] as long as the patient will be able to ride in a regular vehicle       Expected Discharge Plan and Services           Expected Discharge Date: 08/30/18                                     Social Determinants of Health (SDOH) Interventions    Readmission Risk Interventions No flowsheet data found.

## 2018-08-30 NOTE — Progress Notes (Signed)
[facility] in [place]

## 2018-08-30 NOTE — Progress Notes (Signed)
   Subjective:    Patient reports pain as moderate.  Left knee pain and swelling improving.  Patient is well, and has had no acute complaints or problems Denies any hx of blood clots, cough, CP, SOB, ABD pain.   Objective: Vital signs in last 24 hours: Temp:  [98.6 F (37 C)-99.6 F (37.6 C)] 98.6 F (37 C) (07/28 3149) Pulse Rate:  [106-109] 106 (07/28 0822) Resp:  [17-20] 17 (07/28 0822) BP: (145-170)/(77-97) 145/86 (07/28 0822) SpO2:  [100 %] 100 % (07/28 0822)  Intake/Output from previous day: 07/27 0701 - 07/28 0700 In: 1530.7 [P.O.:600; I.V.:930.7] Out: 4480 [Urine:4480] Intake/Output this shift: Total I/O In: 240 [P.O.:240] Out: -   No results for input(s): HGB in the last 72 hours. No results for input(s): WBC, RBC, HCT, PLT in the last 72 hours. No results for input(s): NA, K, CL, CO2, BUN, CREATININE, GLUCOSE, CALCIUM in the last 72 hours. No results for input(s): LABPT, INR in the last 72 hours.  EXAM General - Patient is Alert, Appropriate and Oriented Left lower extremity - Intact pulses distally No cellulitis present Compartment soft,  Calf soft, non tender No warmth or redness.  Resolved edema LLE Normal sensation dorsum of left foot.  Patient able to move toes well Patient with mild flicker of ankle dorsiflexion which is improvement. Dressing -Praveena intact with no drainage   History reviewed. No pertinent past medical history.  Assessment/Plan:      Active Problems:   Rhabdomyolysis  Estimated body mass index is 24.37 kg/m as calculated from the following:   Height as of this encounter: 5\' 6"  (1.676 m).   Weight as of this encounter: 68.5 kg.  Patient with improved left knee pain, swelling.  Minimal improvement of ankle dorsiflexion.   Praveena dressing is intact with no drainage.  No signs of cellulitis, DVT or compartment syndrome.  Continue with Polar Care unit and Praveena dressing  Patient will need follow-up with [facility] in 1  week for xrays, staple removal, steri strip application.  Continue with physical therapy, nonweightbearing left lower extremity  Aspirin 325 mg daily for DVT prophylaxis.   Ronney Asters, PA-C [place] 08/30/2018, 10:51 AM Wound vac applied

## 2018-08-30 NOTE — TOC Progression Note (Signed)
Transition of Care [facility]) - Progression Note    Patient Details  Name: Marvin Roberts MRN: [id number] Date of Birth: 1991-01-08  Transition of Care [facility]) CM/SW Contact  Su Hilt, RN Phone Number: 08/30/2018, 12:27 PM  Clinical Narrative:     I spoke to the Nurse at [phone], they have not gotten a call from the [facility] at all on the patient and even if they had she stated The facility in [place] does not have a bed and cannot accept the patient.  I called Rachelle Hora and explained that the [facility] does not have any drainage and he gives a verbal order to remove the Prevena and apply a Honey Comb dressing.   The county [place] should be able to accept the inmate back without the [place] in place.        Expected Discharge Plan and Services           Expected Discharge Date: 08/30/18                                     Social Determinants of Health (SDOH) Interventions    Readmission Risk Interventions No flowsheet data found.

## 2018-08-30 NOTE — Progress Notes (Signed)
   08/30/18 1000  Clinical Encounter Type  Visited With Patient  Visit Type Initial   Upon arrival, the patient was reclined in bed and watching television. He was alert, oriented, and pleasant and welcoming. Patient shared the history of his present injury and what brought him to the hospital. Chaplain and patient engaged in conversation about the injury and the television program he was enjoying at that time ([employer]). Patient anticipates discharge today. No additional needs at this time.

## 2018-08-30 NOTE — Progress Notes (Signed)
Wound vac dressing removed and honeycombed placed. [name] (RN) at [facility] jail aware of pt coming back to facility.

## 2018-08-30 NOTE — Progress Notes (Signed)
Meds given, wound vac changed to prevena.

## 2018-08-30 NOTE — Discharge Summary (Signed)
[facility] [place] [facility]   PATIENT NAME: Marvin Roberts    MR#:  [id number]  DATE OF BIRTH:  12/22/1990  DATE OF ADMISSION:  08/23/2018 ADMITTING PHYSICIAN: Hannah BeatJan A Mansy, MD  DATE OF DISCHARGE: 08/30/2018  PRIMARY CARE PHYSICIAN: Patient, No Pcp Per    ADMISSION DIAGNOSIS:  Left leg pain [M79.605] Traumatic rhabdomyolysis, initial encounter (HCC) [T79.6XXA] Acute post-operative pain [G89.18]  DISCHARGE DIAGNOSIS:  Active Problems:   Rhabdomyolysis   SECONDARY DIAGNOSIS:  History reviewed. No pertinent past medical history.  HOSPITAL COURSE:   1. Acute rhabdomyolysis. -Secondary to poor mobility and patient presented to the hospital with CK is over 20,000. -Patient was aggressively hydrated with IV fluids and CKs have significantly improved and trending down.  On the day of discharge patient CKs are 1900.  He denies any significant muscle pain or any further weakness.  His renal function is stable. -he will be discharged and was advised to aggressively hydrate himself.  has improved with IV fluid hydration.  CK's down to 3200 today and will cont. To monitor. -Continue IV fluid hydration, follow CKs.  Renal function is stable.  2. Sinus tachycardia.This looks secondary to #1 with associated volume depletion.  -This is improved and resolved with IV fluid hydration.  3.Recent left tibial plateau fracture status post ORIF.  -Orthopedics was consulted, patient had a wound VAC in place but apparently cannot go back to jail with a wound VAC and therefore that was removed, honeycomb dressing has been placed.  Continue local wound care and patient is nonweightbearing on the left leg.  Patient to follow-up with orthopedics within the next 1 to 2 weeks.  4.  Essential hypertension- patient was discharged on some low-dose lisinopril. -His meds likely need to be adjusted further as an outpatient.  DISCHARGE CONDITIONS:   Stable.   CONSULTS OBTAINED:   Treatment Team:  Kennedy BuckerMenz, Michael, MD Donato HeinzHooten, James P, MD  DRUG ALLERGIES:   Allergies  Allergen Reactions  . Coconut Oil Rash    DISCHARGE MEDICATIONS:   Allergies as of 08/30/2018      Reactions   Coconut Oil Rash      Medication List    TAKE these medications   aspirin EC 325 MG tablet Take 1 tablet (325 mg total) by mouth daily.   HYDROcodone-acetaminophen 5-325 MG tablet Commonly known as: NORCO/VICODIN Take 1-2 tablets by mouth every 6 (six) hours as needed for moderate pain.   lisinopril 10 MG tablet Commonly known as: ZESTRIL Take 1 tablet (10 mg total) by mouth daily.   traMADol 50 MG tablet Commonly known as: ULTRAM Take 1 tablet (50 mg total) by mouth every 6 (six) hours.         DISCHARGE INSTRUCTIONS:   DIET:  Cardiac diet  DISCHARGE CONDITION:  Stable  ACTIVITY:  Activity as tolerated Non-weight bearing on the left leg.   OXYGEN:  Home Oxygen: No.   Oxygen Delivery: room air  DISCHARGE [facility]   If you experience worsening of your admission symptoms, develop shortness of breath, life threatening emergency, suicidal or homicidal thoughts you must seek medical attention immediately by calling 911 or calling your MD immediately  if symptoms less severe.  You Must read complete instructions/literature along with all the possible adverse reactions/side effects for all the Medicines you take and that have been prescribed to you. Take any new Medicines after you have completely understood and accpet all the possible adverse reactions/side effects.   Please  note  You were cared for by a hospitalist during your hospital stay. If you have any questions about your discharge medications or the care you received while you were in the hospital after you are discharged, you can call the unit and asked to speak with the hospitalist on call if the hospitalist that took care of you is not available. Once you are discharged, your primary care  physician will handle any further medical issues. Please note that NO REFILLS for any discharge medications will be authorized once you are discharged, as it is imperative that you return to your primary care physician (or establish a relationship with a primary care physician if you do not have one) for your aftercare needs so that they can reassess your need for medications and monitor your lab values.     Today   Pain in the left knee has improved.  CK's have trended down. Will d/c to [facility] today.   VITAL SIGNS:  Blood pressure (!) 159/93, pulse (!) 136, temperature 98.9 F (37.2 C), temperature source Oral, resp. rate 17, height 5\' 6"  (1.676 m), weight 68.5 kg, SpO2 100 %.  I/O:    Intake/Output Summary (Last 24 hours) at 08/30/2018 1436 Last data filed at 08/30/2018 0900 Gross per 24 hour  Intake 1410.74 ml  Output 3140 ml  Net -1729.26 ml    PHYSICAL EXAMINATION:   GENERAL:  28 y.o.-year-old patient lying in bed in no acute distress.  EYES: Pupils equal, round, reactive to light and accommodation. No scleral icterus. Extraocular muscles intact.  HEENT: Head atraumatic, normocephalic. Oropharynx and nasopharynx clear.  NECK:  Supple, no jugular venous distention. No thyroid enlargement, no tenderness.  LUNGS: Normal breath sounds bilaterally, no wheezing, rales, rhonchi. No use of accessory muscles of respiration.  CARDIOVASCULAR: S1, S2 normal. No murmurs, rubs, or gallops.  ABDOMEN: Soft, nontender, nondistended. Bowel sounds present. No organomegaly or mass.  EXTREMITIES: No cyanosis, clubbing or edema b/l.  Left knee dressing in place. No drainage. No redness,swelling noted.  NEUROLOGIC: Cranial nerves II through XII are intact. No focal Motor or sensory deficits b/l.   PSYCHIATRIC: The patient is alert and oriented x 3.  SKIN: No obvious rash, lesion, or ulcer.  DATA REVIEW:   CBC Recent Labs  Lab 08/27/18 0432  WBC 6.8  HGB 9.5*  HCT 29.9*  PLT 246     Chemistries  Recent Labs  Lab 08/23/18 1935  08/27/18 0432  NA 139   < > 137  K 4.5   < > 4.5  CL 100   < > 104  CO2 30   < > 28  GLUCOSE 89   < > 107*  BUN 9   < > 8  CREATININE 0.95   < > 0.73  CALCIUM 9.4   < > 8.5*  MG  --    < > 2.4  AST 288*  --   --   ALT 62*  --   --   ALKPHOS 48  --   --   BILITOT 0.9  --   --    < > = values in this interval not displayed.    Cardiac Enzymes No results for input(s): TROPONINI in the last 168 hours.  Microbiology Results  Results for orders placed or performed during the hospital encounter of 08/23/18  Blood Culture (routine x 2)     Status: None   Collection Time: 08/23/18  7:35 PM   Specimen: BLOOD  Result Value Ref  Range Status   Specimen Description   Final    BLOOD LEFT ANTECUBITAL Performed at [facility], [address] [place], [place] [address]    Special Requests   Final    BOTTLES DRAWN AEROBIC AND ANAEROBIC Blood Culture results may not be optimal due to an excessive volume of blood received in culture bottles Performed at [facility], [address] [place], [place] [address]    Culture   Final    NO GROWTH 5 DAYS Performed at [facility], [address] [place], [place] [address]    Report Status 08/28/2018 FINAL  Final  Blood Culture (routine x 2)     Status: None   Collection Time: 08/23/18  7:44 PM   Specimen: BLOOD  Result Value Ref Range Status   Specimen Description BLOOD BLOOD LEFT FOREARM  Final   Special Requests   Final    BOTTLES DRAWN AEROBIC AND ANAEROBIC Blood Culture adequate volume   Culture   Final    NO GROWTH 5 DAYS Performed at [facility], [address] [place], [place] [address]    Report Status 08/28/2018 FINAL  Final  SARS Coronavirus 2 (CEPHEID- Performed in [facility] lab), Hosp Order     Status: None   Collection Time: 08/23/18  7:56 PM   Specimen: Nasopharyngeal Swab  Result Value Ref Range Status   SARS  Coronavirus 2 NEGATIVE NEGATIVE Final    Comment: (NOTE) If result is NEGATIVE SARS-CoV-2 target nucleic acids are NOT DETECTED. The SARS-CoV-2 RNA is generally detectable in upper and lower  respiratory specimens during the acute phase of infection. The lowest  concentration of SARS-CoV-2 viral copies this assay can detect is 250  copies / mL. A negative result does not preclude SARS-CoV-2 infection  and should not be used as the sole basis for treatment or other  patient management decisions.  A negative result may occur with  improper specimen collection / handling, submission of specimen other  than nasopharyngeal swab, presence of viral mutation(s) within the  areas targeted by this assay, and inadequate number of viral copies  (<250 copies / mL). A negative result must be combined with clinical  observations, patient history, and epidemiological information. If result is POSITIVE SARS-CoV-2 target nucleic acids are DETECTED. The SARS-CoV-2 RNA is generally detectable in upper and lower  respiratory specimens dur ing the acute phase of infection.  Positive  results are indicative of active infection with SARS-CoV-2.  Clinical  correlation with patient history and other diagnostic information is  necessary to determine patient infection status.  Positive results do  not rule out bacterial infection or co-infection with other viruses. If result is PRESUMPTIVE POSTIVE SARS-CoV-2 nucleic acids MAY BE PRESENT.   A presumptive positive result was obtained on the submitted specimen  and confirmed on repeat testing.  While 2019 novel coronavirus  (SARS-CoV-2) nucleic acids may be present in the submitted sample  additional confirmatory testing may be necessary for epidemiological  and / or clinical management purposes  to differentiate between  SARS-CoV-2 and other Sarbecovirus currently known to infect humans.  If clinically indicated additional testing with an alternate test   methodology [phone]([id number]) is advised. The SARS-CoV-2 RNA is generally  detectable in upper and lower respiratory sp ecimens during the acute  phase of infection. The expected result is Negative. Fact Sheet for Patients:  BoilerBrush.com.cyhttps://www.fda.gov/media/136312/download Fact Sheet for Healthcare Providers: https://pope.com/https://www.fda.gov/media/136313/download This test is not yet approved or cleared by  the [employer] [place] FDA and has been authorized for detection and/or diagnosis of SARS-CoV-2 by FDA under an Emergency Use Authorization (EUA).  This EUA will remain in effect (meaning this test can be used) for the duration of the COVID-19 declaration under Section 564(b)(1) of the Act, 21 U.S.C. section 360bbb-3(b)(1), unless the authorization is terminated or revoked sooner. Performed at [facility], [address] [place], [place] [address]   Urine culture     Status: None   Collection Time: 08/24/18  5:25 AM   Specimen: Urine, Random  Result Value Ref Range Status   Specimen Description   Final    URINE, RANDOM Performed at [facility], [address] [place], [place] [address]    Special Requests   Final    NONE Performed at [facility], [address] [place], [place] [address]    Culture   Final    NO GROWTH Performed at [facility], [address] [place], [place] [address]    Report Status 08/25/2018 FINAL  Final    RADIOLOGY:  [place] Venous Img Lower Unilateral Left  Result Date: 08/29/2018 CLINICAL DATA:  Left lower extremity pain and edema. History of smoking. History of prior DVT. Recent ORIF of tibial plateau fracture. Evaluate for acute or chronic DVT. EXAM: LEFT LOWER EXTREMITY VENOUS DOPPLER ULTRASOUND TECHNIQUE: Gray-scale sonography with graded compression, as well as color Doppler and duplex ultrasound were performed to evaluate the lower extremity deep venous systems from the level of the common femoral vein and including the common femoral,  femoral, profunda femoral, popliteal and calf veins including the posterior tibial, peroneal and gastrocnemius veins when visible. The superficial great saphenous vein was also interrogated. Spectral Doppler was utilized to evaluate flow at rest and with distal augmentation maneuvers in the common femoral, femoral and popliteal veins. COMPARISON:  Left knee radiographs-08/23/2018 FINDINGS: Contralateral Common Femoral Vein: Respiratory phasicity is normal and symmetric with the symptomatic side. No evidence of thrombus. Normal compressibility. Common Femoral Vein: No evidence of thrombus. Normal compressibility, respiratory phasicity and response to augmentation. Saphenofemoral Junction: No evidence of thrombus. Normal compressibility and flow on color Doppler imaging. Profunda Femoral Vein: No evidence of thrombus. Normal compressibility and flow on color Doppler imaging. Femoral Vein: No evidence of thrombus. Normal compressibility, respiratory phasicity and response to augmentation. Popliteal Vein: No evidence of thrombus. Normal compressibility, respiratory phasicity and response to augmentation. Calf Veins: No evidence of thrombus. Normal compressibility and flow on color Doppler imaging. Superficial Great Saphenous Vein: No evidence of thrombus. Normal compressibility. Venous Reflux:  None. Other Findings:  None. IMPRESSION: No evidence of DVT within the left lower extremity. Electronically Signed   By: Simonne ComeJohn  Watts M.D.   On: 08/29/2018 09:54      Management plans discussed with the patient, family and they are in agreement.  CODE STATUS:     Code Status Orders  (From admission, onward)         Start     Ordered   08/24/18 0146  Full code  Continuous     08/24/18 0146         TOTAL TIME TAKING CARE OF THIS PATIENT: 40 minutes.    Houston SirenVivek J Fredia Chittenden M.D on 08/30/2018 at 2:36 PM  Between 7am to 6pm - Pager - [phone]  After 6pm go to [email or web address] - Social research officer, governmentpassword [email or web address]  [facility] [place] [facility]  Office  [phone]  CC: Primary care physician; Patient, No Pcp Per

## 2021-03-18 IMAGING — US VENOUS DOPPLER ULTRASOUND OF LEFT LOWER EXTREMITY
1 series · 13 of 24 positions shown · non-contrast
Comparison: Left knee radiographs-08/23/2018

CLINICAL DATA: Left lower extremity pain and edema. History of
smoking. History of prior DVT. Recent ORIF of tibial plateau
fracture. Evaluate for acute or chronic DVT.



[Series 1: venous doppler ultrasound of left lower extremity · 13 of 33 slices shown]
[im 1/33]
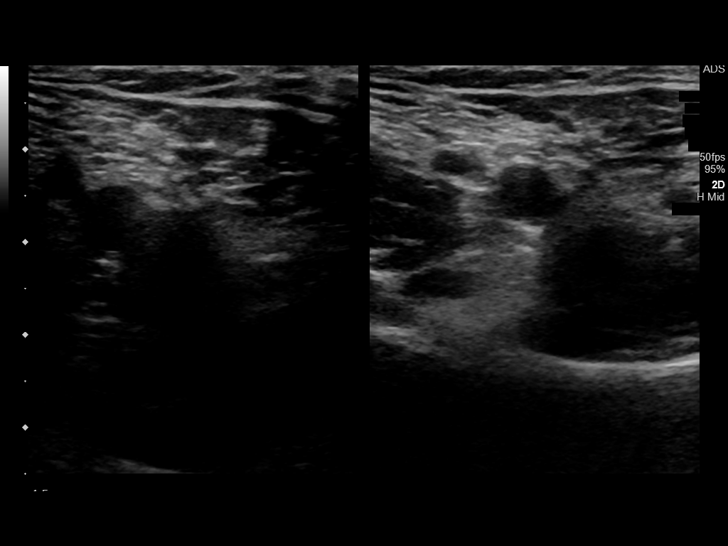
[im 3/33]
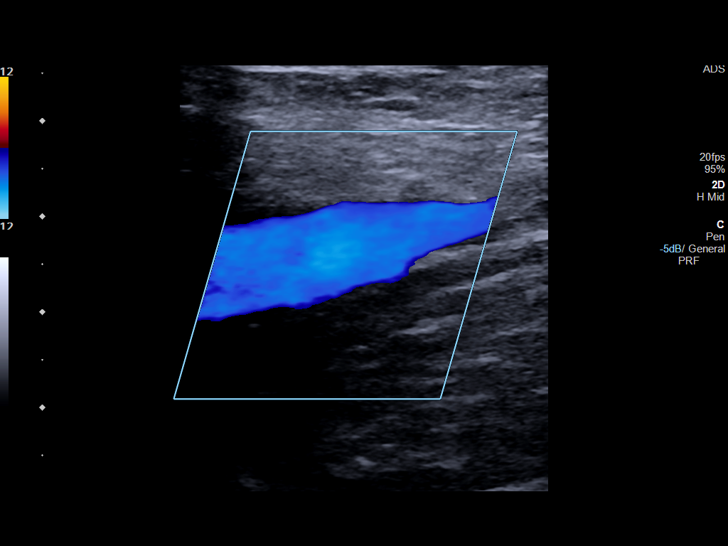
[im 6/33]
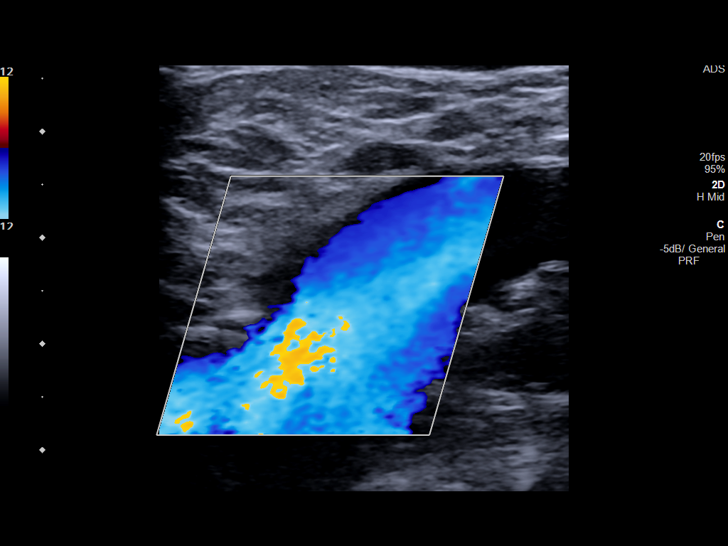
[im 9/33]
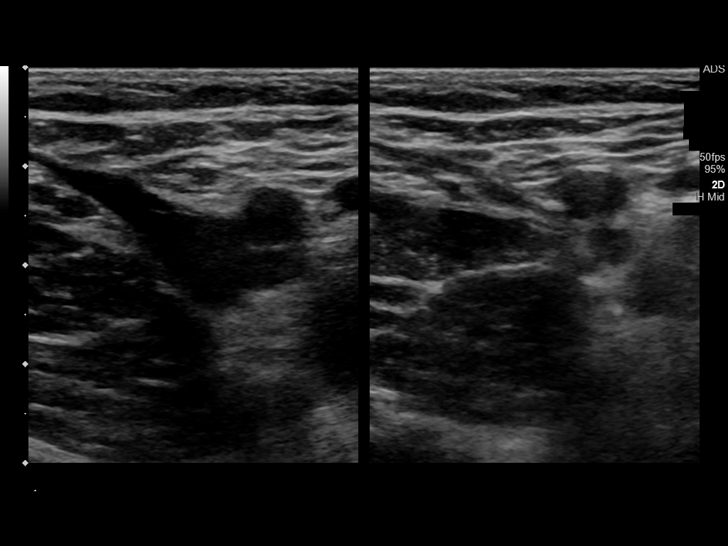
[im 12/33]
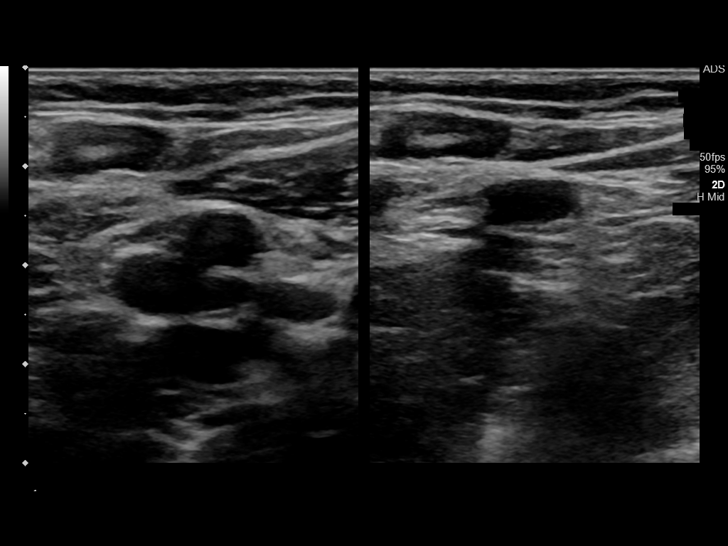
[im 14/33]
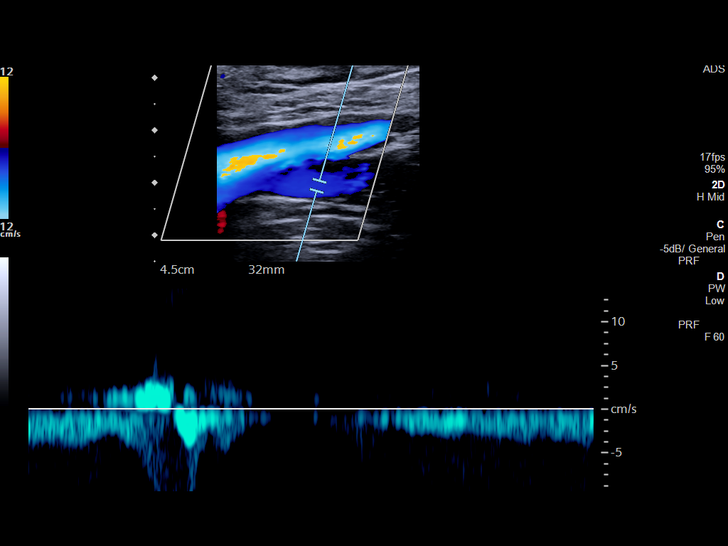
[im 17/33]
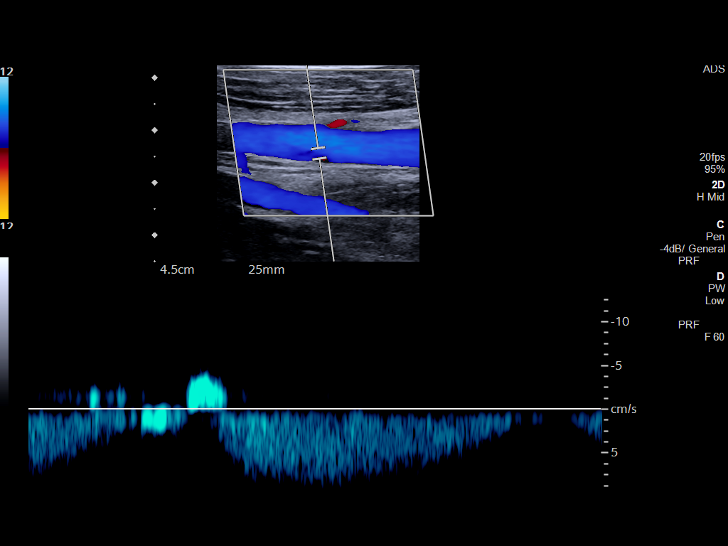
[im 19/33]
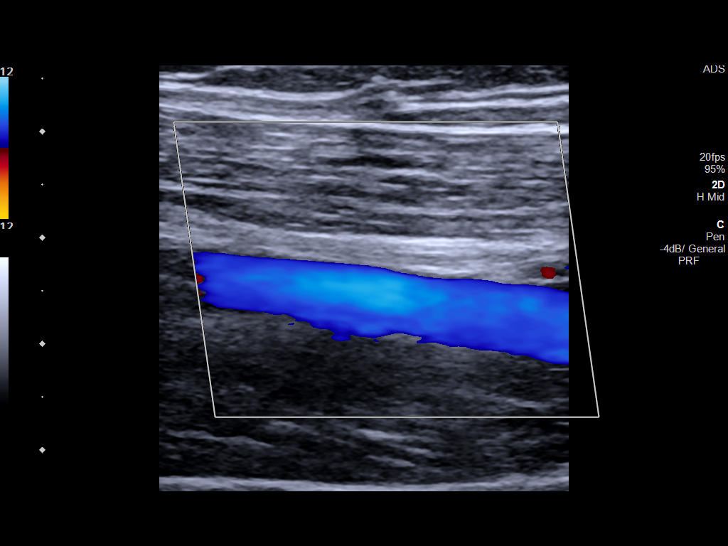
[im 21/33]
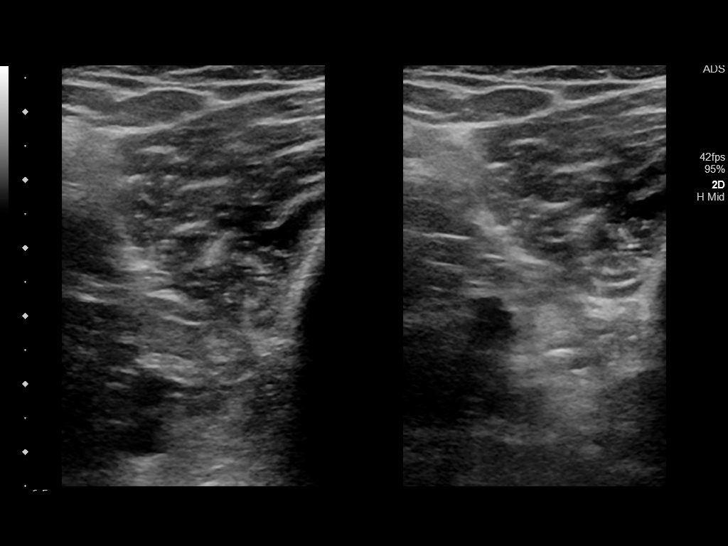
[im 24/33]
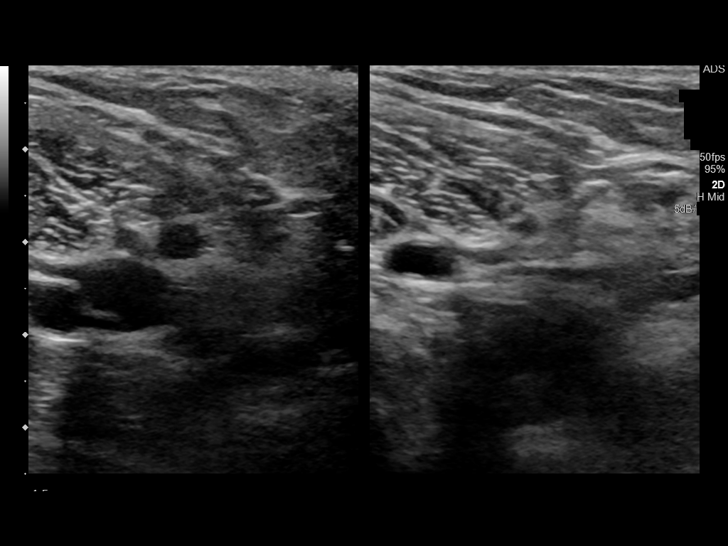
[im 27/33]
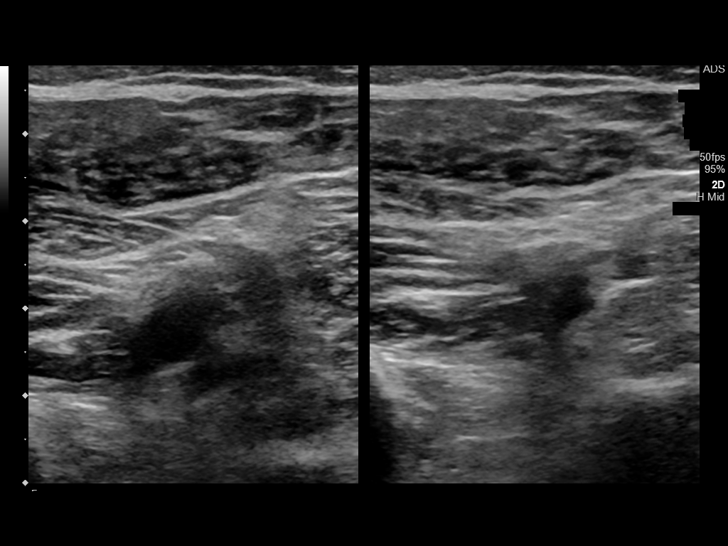
[im 30/33]
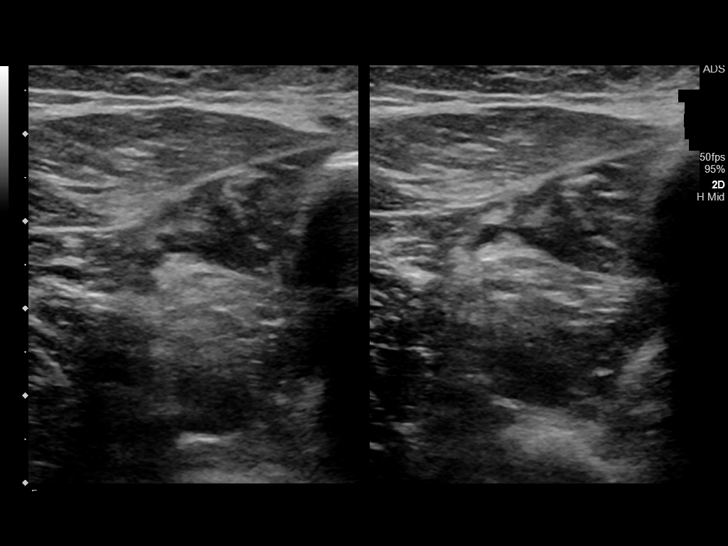
[im 33/33]
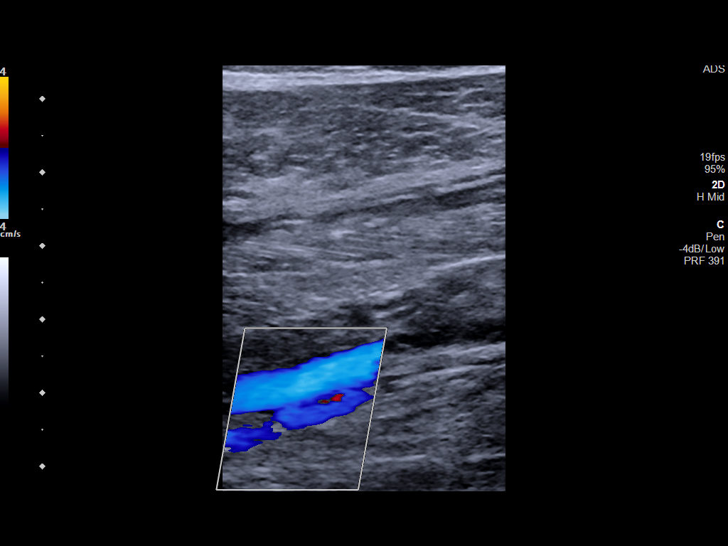

[13 of 24 positions shown; findings below may reference images not displayed]

FINDINGS: Contralateral Common Femoral Vein: Respiratory phasicity is normal
and symmetric with the symptomatic side. No evidence of thrombus.
Normal compressibility.

Common Femoral Vein: No evidence of thrombus. Normal
compressibility, respiratory phasicity and response to augmentation.

Saphenofemoral Junction: No evidence of thrombus. Normal
compressibility and flow on color Doppler imaging.

Profunda Femoral Vein: No evidence of thrombus. Normal
compressibility and flow on color Doppler imaging.

Femoral Vein: No evidence of thrombus. Normal compressibility,
respiratory phasicity and response to augmentation.

Popliteal Vein: No evidence of thrombus. Normal compressibility,
respiratory phasicity and response to augmentation.

Calf Veins: No evidence of thrombus. Normal compressibility and flow
on color Doppler imaging.

Superficial Great Saphenous Vein: No evidence of thrombus. Normal
compressibility.

Venous Reflux:  None.

Other Findings:  None.
IMPRESSION: No evidence of DVT within the left lower extremity.
